# Patient Record
Sex: Female | Born: 1935 | Race: Black or African American | Hispanic: No | Marital: Married | Smoking: Former smoker
Health system: Southern US, Community
[De-identification: ages and names within clinical notes are randomized; demographics above are authoritative.]

## PROBLEM LIST (undated history)

## (undated) DIAGNOSIS — E213 Hyperparathyroidism, unspecified: Secondary | ICD-10-CM

## (undated) DIAGNOSIS — I10 Essential (primary) hypertension: Secondary | ICD-10-CM

## (undated) DIAGNOSIS — D649 Anemia, unspecified: Secondary | ICD-10-CM

## (undated) DIAGNOSIS — I509 Heart failure, unspecified: Secondary | ICD-10-CM

## (undated) DIAGNOSIS — K5790 Diverticulosis of intestine, part unspecified, without perforation or abscess without bleeding: Secondary | ICD-10-CM

## (undated) DIAGNOSIS — G8929 Other chronic pain: Secondary | ICD-10-CM

## (undated) DIAGNOSIS — E079 Disorder of thyroid, unspecified: Secondary | ICD-10-CM

## (undated) HISTORY — PX: THYROIDECTOMY: SHX17

## (undated) HISTORY — DX: Other chronic pain: G89.29

## (undated) HISTORY — DX: Hyperparathyroidism, unspecified: E21.3

## (undated) HISTORY — DX: Anemia, unspecified: D64.9

## (undated) HISTORY — PX: COLONOSCOPY: SHX174

## (undated) HISTORY — PX: ABDOMINAL HYSTERECTOMY: SHX81

---

## 2005-08-13 ENCOUNTER — Ambulatory Visit: Payer: Self-pay | Admitting: Family Medicine

## 2006-08-16 ENCOUNTER — Ambulatory Visit: Payer: Self-pay | Admitting: Family Medicine

## 2007-11-07 ENCOUNTER — Ambulatory Visit: Payer: Self-pay | Admitting: Family Medicine

## 2009-04-11 ENCOUNTER — Ambulatory Visit: Payer: Self-pay | Admitting: Family Medicine

## 2009-04-18 ENCOUNTER — Ambulatory Visit: Payer: Self-pay | Admitting: Family Medicine

## 2010-10-23 ENCOUNTER — Ambulatory Visit: Payer: Self-pay | Admitting: Family Medicine

## 2011-10-27 ENCOUNTER — Ambulatory Visit: Payer: Self-pay | Admitting: Family Medicine

## 2013-10-19 ENCOUNTER — Ambulatory Visit: Payer: Self-pay

## 2013-10-19 LAB — COMPREHENSIVE METABOLIC PANEL
ANION GAP: 8 (ref 7–16)
AST: 20 U/L (ref 15–37)
Albumin: 4.5 g/dL (ref 3.4–5.0)
Alkaline Phosphatase: 74 U/L
BUN: 17 mg/dL (ref 7–18)
Bilirubin,Total: 0.7 mg/dL (ref 0.2–1.0)
CHLORIDE: 100 mmol/L (ref 98–107)
Calcium, Total: 11.4 mg/dL — ABNORMAL HIGH (ref 8.5–10.1)
Co2: 31 mmol/L (ref 21–32)
Creatinine: 1.02 mg/dL (ref 0.60–1.30)
EGFR (African American): >60
EGFR (Non-African Amer.): 54 — ABNORMAL LOW
Glucose: 95 mg/dL (ref 65–99)
Osmolality: 279 (ref 275–301)
POTASSIUM: 4 mmol/L (ref 3.5–5.1)
SGPT (ALT): 18 U/L (ref 12–78)
Sodium: 139 mmol/L (ref 136–145)
Total Protein: 8.3 g/dL — ABNORMAL HIGH (ref 6.4–8.2)

## 2013-10-19 LAB — CBC WITH DIFFERENTIAL/PLATELET
Basophil #: 0 10*3/uL (ref 0.0–0.1)
Basophil %: 0.7 %
EOS ABS: 0 10*3/uL (ref 0.0–0.7)
EOS PCT: 0.3 %
HCT: 41.6 % (ref 35.0–47.0)
HGB: 14.1 g/dL (ref 12.0–16.0)
Lymphocyte #: 1.7 10*3/uL (ref 1.0–3.6)
Lymphocyte %: 26.7 %
MCH: 30.6 pg (ref 26.0–34.0)
MCHC: 34 g/dL (ref 32.0–36.0)
MCV: 90 fL (ref 80–100)
Monocyte #: 0.4 x10 3/mm (ref 0.2–0.9)
Monocyte %: 6.7 %
NEUTROS ABS: 4.1 10*3/uL (ref 1.4–6.5)
Neutrophil %: 65.6 %
PLATELETS: 244 10*3/uL (ref 150–440)
RBC: 4.62 10*6/uL (ref 3.80–5.20)
RDW: 12.5 % (ref 11.5–14.5)
WBC: 6.2 10*3/uL (ref 3.6–11.0)

## 2013-10-19 LAB — TROPONIN I: Troponin-I: <0.02 ng/mL

## 2013-10-20 ENCOUNTER — Observation Stay: Payer: Self-pay | Admitting: Internal Medicine

## 2013-10-20 LAB — BASIC METABOLIC PANEL
ANION GAP: 5 — AB (ref 7–16)
BUN: 18 mg/dL (ref 7–18)
CHLORIDE: 101 mmol/L (ref 98–107)
CO2: 30 mmol/L (ref 21–32)
Calcium, Total: 11.3 mg/dL — ABNORMAL HIGH (ref 8.5–10.1)
Creatinine: 1 mg/dL (ref 0.60–1.30)
GFR CALC NON AF AMER: 55 — AB
GLUCOSE: 88 mg/dL (ref 65–99)
Osmolality: 273 (ref 275–301)
Potassium: 3.5 mmol/L (ref 3.5–5.1)
Sodium: 136 mmol/L (ref 136–145)

## 2013-10-20 LAB — CBC WITH DIFFERENTIAL/PLATELET
BASOS PCT: 0.9 %
Basophil #: 0.1 10*3/uL (ref 0.0–0.1)
Eosinophil #: 0 10*3/uL (ref 0.0–0.7)
Eosinophil %: 0.3 %
HCT: 40.8 % (ref 35.0–47.0)
HGB: 14 g/dL (ref 12.0–16.0)
LYMPHS PCT: 28 %
Lymphocyte #: 2.1 10*3/uL (ref 1.0–3.6)
MCH: 31.6 pg (ref 26.0–34.0)
MCHC: 34.4 g/dL (ref 32.0–36.0)
MCV: 92 fL (ref 80–100)
MONO ABS: 0.6 x10 3/mm (ref 0.2–0.9)
Monocyte %: 8.1 %
NEUTROS ABS: 4.6 10*3/uL (ref 1.4–6.5)
NEUTROS PCT: 62.7 %
Platelet: 251 10*3/uL (ref 150–440)
RBC: 4.44 10*6/uL (ref 3.80–5.20)
RDW: 12.7 % (ref 11.5–14.5)
WBC: 7.3 10*3/uL (ref 3.6–11.0)

## 2013-10-20 LAB — CALCIUM: Calcium, Total: 10.7 mg/dL — ABNORMAL HIGH (ref 8.5–10.1)

## 2013-10-20 LAB — MAGNESIUM: Magnesium: 1.8 mg/dL

## 2013-10-20 LAB — TSH: Thyroid Stimulating Horm: 8.69 u[IU]/mL — ABNORMAL HIGH

## 2013-10-20 LAB — T4, FREE: Free Thyroxine: 0.94 ng/dL (ref 0.76–1.46)

## 2013-12-13 ENCOUNTER — Ambulatory Visit: Payer: Self-pay | Admitting: Gastroenterology

## 2015-01-19 NOTE — H&P (Signed)
PATIENT NAME:  Laura Barrera, Laura Barrera MR#:  [id number] DATE OF BIRTH:  04-22-1936  DATE OF ADMISSION:  10/20/2013  PRIMARY CARE PHYSICIAN:  Nonlocal.   REFERRING PHYSICIAN:  Dr. Bayard Malesandolph Brown.   CHIEF COMPLAINT:  The patient is sent from the urgent care center for hypercalcemia.   HISTORY OF PRESENT ILLNESS:  Laura Barrera is a 79 year old African American female with past medical history of hypertension, hyperlipidemia, has been feeling dizzy for the last two days.  Concerning this, went to the urgent care center where a work-up showed the patient has elevated calcium of 11.4.  Concerning this, the patient is sent to the Emergency Department for further work-up.  The patient denied having any polyuria or polydipsia.  Per husband, the patient's confusion is no worse than baseline.  The patient has been experiencing palpitations in the last few days.  Denies having any nausea, vomiting, diarrhea.  CBC, CMP are completely within normal limits except for calcium of 11.4.  The patient denies taking Tums on a regular basis.  Drinks milk occasionally.  Does not take calcium on a regular basis.   PAST MEDICAL HISTORY: 1.  Hypertension.  2.  Hyperlipidemia.   PAST SURGICAL HISTORY:  Hysterectomy.   ALLERGIES:  .   HOME MEDICATIONS: 1.  Metoprolol 100 mg daily.  2.  Lovastatin 40 mg daily.  3.  Lisinopril 1 tablet once a day.  4.  Hydrochlorothiazide 25 mg once a day.  5.  Amlodipine 10 mg once a day.   SOCIAL HISTORY:  No history of smoking, drinking alcohol or using illicit drugs.  Married, lives with her husband.   FAMILY HISTORY:  Diabetes mellitus, hypertension.   REVIEW OF SYSTEMS: CONSTITUTIONAL:  Has been experiencing mild generalized weakness.  EYES:  No change in vision.  EARS, NOSE, THROAT:  No change in hearing.  RESPIRATORY:  No cough, shortness of breath.  CARDIOVASCULAR:  No chest pain, palpitations.  GASTROINTESTINAL:  No nausea, vomiting, abdominal pain.   GENITOURINARY:  No dysuria  or hematuria.  ENDOCRINE:  No polyuria or polydipsia.  SKIN:  No rash or lesions.  MUSCULOSKELETAL:  No joint pains and aches.   NEUROLOGIC:  No weakness or numbness in any part of the body.   PHYSICAL EXAMINATION: GENERAL:  This is a well-built, well-nourished age-appropriate female lying down in the bed, not in distress.  VITAL SIGNS:  Temperature 98.3, pulse 89, blood pressure 160/90, respiratory rate of 18, oxygen saturation is 96% on room air.  HEENT:  Head normocephalic, atraumatic.  Eyes, no scleral icterus.  Conjunctivae normal.  Pupils equal and react to light.  Extraocular movements are intact.  Mucous membranes moist.  No pharyngeal erythema.  NECK:  Supple.  No lymphadenopathy.  No JVD.  No carotid bruit.  No thyromegaly.  CHEST:  Has no focal tenderness.  LUNGS:  Bilaterally clear to auscultation.  HEART:  S1, S2 regular.  No murmurs are heard.  ABDOMEN:  Bowel sounds plus, soft, nontender, nondistended.  EXTREMITIES:  No pedal edema.  Pulses 2+.  NEUROLOGIC:  The patient is alert, oriented to place, person and time.  Cranial nerves II through XII intact.  Motor 5 by 5 in upper and lower extremities.  Per urgent care center the patient was kind of confused and was not able to tell the year.  SKIN:  No rash or lesions.   LABORATORY DATA:  CMP, calcium of 11.4.  The rest of all the values are within normal limits.  Troponin less  than 0.02.  CT head without contrast:  No acute intracranial abnormality.   CBC is completely within normal limits.   ASSESSMENT AND PLAN:  Laura Barrera is a 79 year old female who comes to the Emergency Department with complaints of dizziness.  1.  Dizziness.  We will check the orthostatic blood pressure.  The patient also is complaining of palpitations.  Admit the patient to the monitored bed.  2.  Hypercalcemia.  This could be secondary to hydrochlorothiazide; however, we will also check the PTH level.  No other focal signs of any malignancy at this time.   We will continue with IV fluids and follow up.  3.  Hypertension, moderately-controlled.  Continue the home medications.  4.  Keep the patient on deep vein thrombosis prophylaxis with Lovenox.   TIME SPENT:  45 minutes.    ____________________________ Susa Griffins, MD pv:ea D: 10/20/2013 01:48:47 ET T: 10/20/2013 02:15:36 ET JOB#: [id number]  cc: Susa Griffins, MD, <Dictator> Susa Griffins MD ELECTRONICALLY SIGNED 10/22/2013 21:24

## 2015-01-19 NOTE — Discharge Summary (Signed)
PATIENT NAME:  Laura Barrera, [name] MR#:  [id number] DATE OF BIRTH:  11/14/1935  DATE OF ADMISSION:  10/20/2013  DATE OF DISCHARGE:  10/20/2013  DISCHARGE DIAGNOSES: 1.  Primary hyperparathyroidism with mild hypercalcemia, which is asymptomatic.  2.  Dizziness secondary to dehydration.   3.  Hypertension.  4.  Dementia.   ADMITTING HISTORY AND PHYSICAL AND HOSPITAL COURSE: Please see detailed H and P dictated previously. In brief, a 79 year old female patient presented to the urgent care with some dizziness. She does have some dementia and had confusion, which is not unusual for her. The patient was found to be hypercalcemic, and was admitted to the hospitalist service after presenting at the Emergency Room. The patient was treated with IV fluids. Her hydrochlorothiazide was stopped. Multiple lab studies have been ordered, which include PTH, vitamin D levels, TSH, T3, T4. PTH is mildly elevated at 132. Calcium has come down with IV hydration to 10.7. Her thiazide has been stopped. Vitamin D levels are pending at this time. TSH is mildly elevated, but T3/T4 are normal. The patient has been asked to follow up with her primary care physician, as her hypercalcemia is asymptomatic and has improved with hydration. I have stopped her HCTZ and I advised her to keep herself well hydrated. She will need another calcium repeated in 2 to 4 weeks with her primary care physician. Also, she will need routine followup on her calcium to decide if she needs any surgery or medication treatment for her hypercalcemia.   On examination today, the patient is oriented to place and person, but not to time, which is normal for her. Motor strength is 5/5. She feels well, and is being discharged home.   DISCHARGE MEDICATIONS INCLUDE:   1.  Metoprolol 100 mg oral once a day. 2.  Lisinopril 40 mg daily.  3.  Amlodipine 10 mg daily.  4.  Lovastatin 40 mg daily.   DISCHARGE INSTRUCTIONS: Low sodium diet. Activity as tolerated. Follow  up with primary care physician in 1 to 2 weeks for a repeat calcium check.   Time spent on day of discharge in discharge activity was 40 minutes.     ____________________________ Molinda BailiffSrikar R. Jarrod Mcenery, MD srs:mr D: 10/21/2013 14:26:33 ET T: 10/21/2013 18:38:35 ET JOB#: [id number]  cc: Wardell HeathSrikar R. Eaven Schwager, MD, <Dictator> Orie FishermanSRIKAR R Alayiah Fontes MD ELECTRONICALLY SIGNED 10/24/2013 10:18

## 2016-12-12 ENCOUNTER — Emergency Department
Admit: 2016-12-12 | Discharge: 2016-12-12 | Disposition: A | Discharge status: Home / Self Care | Payer: Medicare Other | Attending: Internal Medicine | Admitting: Internal Medicine

## 2016-12-12 ENCOUNTER — Encounter: Payer: Self-pay | Admitting: Emergency Medicine

## 2016-12-12 ENCOUNTER — Inpatient Hospital Stay
Admission: EM | Admit: 2016-12-12 | Discharge: 2016-12-14 | DRG: 291 | Disposition: A | Discharge status: Home Health | Payer: Medicare Other | Attending: Internal Medicine | Admitting: Internal Medicine

## 2016-12-12 ENCOUNTER — Emergency Department: Payer: Medicare Other

## 2016-12-12 DIAGNOSIS — J9600 Acute respiratory failure, unspecified whether with hypoxia or hypercapnia: Secondary | ICD-10-CM

## 2016-12-12 DIAGNOSIS — I420 Dilated cardiomyopathy: Secondary | ICD-10-CM | POA: Diagnosis present

## 2016-12-12 DIAGNOSIS — I110 Hypertensive heart disease with heart failure: Secondary | ICD-10-CM | POA: Diagnosis present

## 2016-12-12 DIAGNOSIS — E890 Postprocedural hypothyroidism: Secondary | ICD-10-CM | POA: Diagnosis present

## 2016-12-12 DIAGNOSIS — Z87891 Personal history of nicotine dependence: Secondary | ICD-10-CM | POA: Diagnosis not present

## 2016-12-12 DIAGNOSIS — Z888 Allergy status to other drugs, medicaments and biological substances status: Secondary | ICD-10-CM

## 2016-12-12 DIAGNOSIS — I272 Pulmonary hypertension, unspecified: Secondary | ICD-10-CM | POA: Diagnosis present

## 2016-12-12 DIAGNOSIS — E785 Hyperlipidemia, unspecified: Secondary | ICD-10-CM | POA: Diagnosis present

## 2016-12-12 DIAGNOSIS — Z90710 Acquired absence of both cervix and uterus: Secondary | ICD-10-CM | POA: Diagnosis not present

## 2016-12-12 DIAGNOSIS — Z79899 Other long term (current) drug therapy: Secondary | ICD-10-CM | POA: Diagnosis not present

## 2016-12-12 DIAGNOSIS — J9601 Acute respiratory failure with hypoxia: Secondary | ICD-10-CM | POA: Diagnosis present

## 2016-12-12 DIAGNOSIS — I5023 Acute on chronic systolic (congestive) heart failure: Secondary | ICD-10-CM | POA: Diagnosis present

## 2016-12-12 DIAGNOSIS — J449 Chronic obstructive pulmonary disease, unspecified: Secondary | ICD-10-CM | POA: Diagnosis present

## 2016-12-12 DIAGNOSIS — I10 Essential (primary) hypertension: Secondary | ICD-10-CM

## 2016-12-12 DIAGNOSIS — G4710 Hypersomnia, unspecified: Secondary | ICD-10-CM | POA: Diagnosis present

## 2016-12-12 DIAGNOSIS — R0602 Shortness of breath: Secondary | ICD-10-CM

## 2016-12-12 DIAGNOSIS — K5790 Diverticulosis of intestine, part unspecified, without perforation or abscess without bleeding: Secondary | ICD-10-CM | POA: Diagnosis present

## 2016-12-12 DIAGNOSIS — I509 Heart failure, unspecified: Secondary | ICD-10-CM

## 2016-12-12 DIAGNOSIS — I4510 Unspecified right bundle-branch block: Secondary | ICD-10-CM | POA: Diagnosis present

## 2016-12-12 DIAGNOSIS — I5021 Acute systolic (congestive) heart failure: Secondary | ICD-10-CM

## 2016-12-12 HISTORY — DX: Heart failure, unspecified: I50.9

## 2016-12-12 HISTORY — DX: Essential (primary) hypertension: I10

## 2016-12-12 HISTORY — DX: Disorder of thyroid, unspecified: E07.9

## 2016-12-12 HISTORY — DX: Diverticulosis of intestine, part unspecified, without perforation or abscess without bleeding: K57.90

## 2016-12-12 LAB — COMPREHENSIVE METABOLIC PANEL
ALT: 29 U/L (ref 14–54)
AST: 54 U/L — ABNORMAL HIGH (ref 15–41)
Albumin: 3.5 g/dL (ref 3.5–5.0)
Alkaline Phosphatase: 56 U/L (ref 38–126)
Anion gap: 6 (ref 5–15)
BUN: 15 mg/dL (ref 6–20)
CHLORIDE: 108 mmol/L (ref 101–111)
CO2: 28 mmol/L (ref 22–32)
Calcium: 9 mg/dL (ref 8.9–10.3)
Creatinine, Ser: 0.71 mg/dL (ref 0.44–1.00)
Glucose, Bld: 200 mg/dL — ABNORMAL HIGH (ref 65–99)
POTASSIUM: 4.8 mmol/L (ref 3.5–5.1)
Sodium: 142 mmol/L (ref 135–145)
Total Bilirubin: 1 mg/dL (ref 0.3–1.2)
Total Protein: 6.7 g/dL (ref 6.5–8.1)

## 2016-12-12 LAB — BLOOD GAS, ARTERIAL
ACID-BASE EXCESS: 3.3 mmol/L — AB (ref 0.0–2.0)
Bicarbonate: 29.5 mmol/L — ABNORMAL HIGH (ref 20.0–28.0)
DELIVERY SYSTEMS: POSITIVE
Expiratory PAP: 5
FIO2: 40
INSPIRATORY PAP: 10
O2 Saturation: 91 %
PATIENT TEMPERATURE: 37
PCO2 ART: 51 mmHg — AB (ref 32.0–48.0)
PO2 ART: 63 mmHg — AB (ref 83.0–108.0)
pH, Arterial: 7.37 (ref 7.350–7.450)

## 2016-12-12 LAB — URINALYSIS, COMPLETE (UACMP) WITH MICROSCOPIC
Bilirubin Urine: NEGATIVE
Glucose, UA: NEGATIVE mg/dL
HGB URINE DIPSTICK: NEGATIVE
Ketones, ur: NEGATIVE mg/dL
LEUKOCYTES UA: NEGATIVE
NITRITE: NEGATIVE
Protein, ur: 100 mg/dL — AB
SPECIFIC GRAVITY, URINE: 1.018 (ref 1.005–1.030)
pH: 6 (ref 5.0–8.0)

## 2016-12-12 LAB — CBC WITH DIFFERENTIAL/PLATELET
BASOS PCT: 1 %
Basophils Absolute: 0.1 10*3/uL (ref 0–0.1)
EOS PCT: 1 %
Eosinophils Absolute: 0.1 10*3/uL (ref 0–0.7)
HCT: 40.3 % (ref 35.0–47.0)
Hemoglobin: 13.4 g/dL (ref 12.0–16.0)
Lymphocytes Relative: 23 %
Lymphs Abs: 2.6 10*3/uL (ref 1.0–3.6)
MCH: 31.5 pg (ref 26.0–34.0)
MCHC: 33.3 g/dL (ref 32.0–36.0)
MCV: 94.6 fL (ref 80.0–100.0)
MONO ABS: 0.5 10*3/uL (ref 0.2–0.9)
Monocytes Relative: 5 %
Neutro Abs: 7.7 10*3/uL — ABNORMAL HIGH (ref 1.4–6.5)
Neutrophils Relative %: 70 %
PLATELETS: 234 10*3/uL (ref 150–440)
RBC: 4.26 MIL/uL (ref 3.80–5.20)
RDW: 15.4 % — AB (ref 11.5–14.5)
WBC: 11 10*3/uL (ref 3.6–11.0)

## 2016-12-12 LAB — GLUCOSE, CAPILLARY
GLUCOSE-CAPILLARY: 84 mg/dL (ref 65–99)
Glucose-Capillary: 93 mg/dL (ref 65–99)

## 2016-12-12 LAB — TROPONIN I
TROPONIN I: 0.03 ng/mL — AB (ref <0.03)
TROPONIN I: 0.04 ng/mL — AB (ref <0.03)

## 2016-12-12 LAB — BRAIN NATRIURETIC PEPTIDE: B NATRIURETIC PEPTIDE 5: 4253 pg/mL — AB (ref 0.0–100.0)

## 2016-12-12 LAB — PROTIME-INR
INR: 1.13
PROTHROMBIN TIME: 14.6 s (ref 11.4–15.2)

## 2016-12-12 LAB — APTT: aPTT: 52 seconds — ABNORMAL HIGH (ref 24–36)

## 2016-12-12 MED ORDER — VITAMIN D (ERGOCALCIFEROL) 1.25 MG (50000 UNIT) PO CAPS: 50000.0000 [IU] | ORAL_CAPSULE | ORAL | Status: DC

## 2016-12-12 MED ORDER — ROSUVASTATIN CALCIUM 10 MG PO TABS
5.0000 mg | ORAL_TABLET | Freq: Every day | ORAL | Status: DC
  Administered 2016-12-12 – 2016-12-14 (×3): 5 mg via ORAL
  Filled 2016-12-12 (×3): qty 1

## 2016-12-12 MED ORDER — ASPIRIN EC 81 MG PO TBEC
81.0000 mg | DELAYED_RELEASE_TABLET | Freq: Every day | ORAL | Status: DC
  Administered 2016-12-13 – 2016-12-14 (×2): 81 mg via ORAL
  Filled 2016-12-12 (×3): qty 1

## 2016-12-12 MED ORDER — DEXTROSE 5 % IV SOLN
1.0000 g | INTRAVENOUS | Status: DC
  Filled 2016-12-12: qty 10

## 2016-12-12 MED ORDER — DOCUSATE SODIUM 100 MG PO CAPS
100.0000 mg | ORAL_CAPSULE | Freq: Two times a day (BID) | ORAL | Status: DC
  Administered 2016-12-12 – 2016-12-14 (×5): 100 mg via ORAL
  Filled 2016-12-12 (×5): qty 1

## 2016-12-12 MED ORDER — SODIUM CHLORIDE 0.9% FLUSH: 3.0000 mL | INTRAVENOUS | Status: DC | PRN

## 2016-12-12 MED ORDER — ACETAMINOPHEN 650 MG RE SUPP: 650.0000 mg | Freq: Four times a day (QID) | RECTAL | Status: DC | PRN

## 2016-12-12 MED ORDER — SODIUM CHLORIDE 0.9 % IV SOLN: 250.0000 mL | INTRAVENOUS | Status: DC | PRN

## 2016-12-12 MED ORDER — SODIUM CHLORIDE 0.9% FLUSH
3.0000 mL | Freq: Two times a day (BID) | INTRAVENOUS | Status: DC
  Administered 2016-12-12 – 2016-12-13 (×4): 3 mL via INTRAVENOUS

## 2016-12-12 MED ORDER — IPRATROPIUM-ALBUTEROL 0.5-2.5 (3) MG/3ML IN SOLN
3.0000 mL | Freq: Four times a day (QID) | RESPIRATORY_TRACT | Status: DC
  Administered 2016-12-12 – 2016-12-14 (×7): 3 mL via RESPIRATORY_TRACT
  Filled 2016-12-12 (×7): qty 3

## 2016-12-12 MED ORDER — CARVEDILOL 6.25 MG PO TABS
6.2500 mg | ORAL_TABLET | Freq: Two times a day (BID) | ORAL | Status: DC
  Administered 2016-12-12 – 2016-12-14 (×4): 6.25 mg via ORAL
  Filled 2016-12-12 (×4): qty 1

## 2016-12-12 MED ORDER — CEFTRIAXONE SODIUM-DEXTROSE 1-3.74 GM-% IV SOLR
INTRAVENOUS | Status: AC
  Administered 2016-12-12: 1000 mg
  Filled 2016-12-12: qty 50

## 2016-12-12 MED ORDER — ACETAMINOPHEN 325 MG PO TABS: 650.0000 mg | ORAL_TABLET | Freq: Four times a day (QID) | ORAL | Status: DC | PRN

## 2016-12-12 MED ORDER — INSULIN ASPART 100 UNIT/ML ~~LOC~~ SOLN: 0.0000 [IU] | Freq: Three times a day (TID) | SUBCUTANEOUS | Status: DC

## 2016-12-12 MED ORDER — FUROSEMIDE 10 MG/ML IJ SOLN
60.0000 mg | Freq: Once | INTRAMUSCULAR | Status: AC
  Administered 2016-12-12: 60 mg via INTRAVENOUS
  Filled 2016-12-12: qty 8

## 2016-12-12 MED ORDER — ONDANSETRON HCL 4 MG/2ML IJ SOLN: 4.0000 mg | Freq: Four times a day (QID) | INTRAMUSCULAR | Status: DC | PRN

## 2016-12-12 MED ORDER — ENOXAPARIN SODIUM 40 MG/0.4ML ~~LOC~~ SOLN
40.0000 mg | Freq: Every day | SUBCUTANEOUS | Status: DC
  Administered 2016-12-12 – 2016-12-13 (×2): 40 mg via SUBCUTANEOUS
  Filled 2016-12-12 (×2): qty 0.4

## 2016-12-12 MED ORDER — BISACODYL 10 MG RE SUPP: 10.0000 mg | Freq: Every day | RECTAL | Status: DC | PRN

## 2016-12-12 MED ORDER — AMLODIPINE BESYLATE 10 MG PO TABS
10.0000 mg | ORAL_TABLET | Freq: Every day | ORAL | Status: DC
  Administered 2016-12-12 – 2016-12-13 (×2): 10 mg via ORAL
  Filled 2016-12-12 (×2): qty 1

## 2016-12-12 MED ORDER — LEVOTHYROXINE SODIUM 88 MCG PO TABS
88.0000 ug | ORAL_TABLET | Freq: Every day | ORAL | Status: DC
  Administered 2016-12-12 – 2016-12-14 (×3): 88 ug via ORAL
  Filled 2016-12-12 (×3): qty 1

## 2016-12-12 MED ORDER — FUROSEMIDE 10 MG/ML IJ SOLN
40.0000 mg | Freq: Two times a day (BID) | INTRAMUSCULAR | Status: DC
  Administered 2016-12-12 – 2016-12-13 (×2): 40 mg via INTRAVENOUS
  Filled 2016-12-12 (×2): qty 4

## 2016-12-12 MED ORDER — LISINOPRIL 20 MG PO TABS
40.0000 mg | ORAL_TABLET | Freq: Every day | ORAL | Status: DC
  Administered 2016-12-12 – 2016-12-13 (×2): 40 mg via ORAL
  Filled 2016-12-12 (×2): qty 2

## 2016-12-12 MED ORDER — ALPRAZOLAM 0.25 MG PO TABS
0.2500 mg | ORAL_TABLET | Freq: Once | ORAL | Status: AC
  Administered 2016-12-12: 0.25 mg via ORAL
  Filled 2016-12-12: qty 1

## 2016-12-12 MED ORDER — IPRATROPIUM-ALBUTEROL 0.5-2.5 (3) MG/3ML IN SOLN
RESPIRATORY_TRACT | Status: AC
  Administered 2016-12-12: 3 mL via RESPIRATORY_TRACT
  Filled 2016-12-12: qty 3

## 2016-12-12 MED ORDER — SODIUM CHLORIDE 0.9% FLUSH
3.0000 mL | Freq: Two times a day (BID) | INTRAVENOUS | Status: DC
  Administered 2016-12-12 (×2): 3 mL via INTRAVENOUS

## 2016-12-12 MED ORDER — POTASSIUM CHLORIDE CRYS ER 20 MEQ PO TBCR
20.0000 meq | EXTENDED_RELEASE_TABLET | Freq: Every day | ORAL | Status: DC
  Administered 2016-12-12 – 2016-12-14 (×3): 20 meq via ORAL
  Filled 2016-12-12 (×3): qty 1

## 2016-12-12 MED ORDER — ONDANSETRON HCL 4 MG PO TABS: 4.0000 mg | ORAL_TABLET | Freq: Four times a day (QID) | ORAL | Status: DC | PRN

## 2016-12-12 NOTE — ED Notes (Signed)
Spoke with Dr. Judithann SheenSparks re: pt oxygen sats. Pt has been stable on 4L and recently reduced to 3L Laura Barrera.  No SOB or labored breathing noted. Pt reports feeling better. Pt has had significant diuresis.  Plan discussed with patient and family.

## 2016-12-12 NOTE — H&P (Signed)
History and Physical    SERENITI WAN ZOX:[id number] DOB: 1936-03-22 DOA: 12/12/2016  Referring physician: Dr. Alphonzo Lemmings PCP: [facility]  Specialists: Dr. Lady Gary  Chief Complaint: SOB  HPI: KENNESHIA REHM is a 81 y.o. female has a past medical history significant for cardiomyopathy with EF=35% and CHF now with worsening SOB. Some cough. No fever. Denies CP. Weight at home has been stable. In ER, pt severely tachypneic and hypoxic requiring BiPAP. CXR shows CHF and possible RLL pneumonia. BNP elevated. She is now admitted. No N/V/D. Some LE edema  Review of Systems: The patient denies anorexia, fever, weight loss,, vision loss, decreased hearing, hoarseness, chest pain, syncope, balance deficits, hemoptysis, abdominal pain, melena, hematochezia, severe indigestion/heartburn, hematuria, incontinence, genital sores, muscle weakness, suspicious skin lesions, transient blindness, difficulty walking, depression, unusual weight change, abnormal bleeding, enlarged lymph nodes, angioedema, and breast masses.   Past Medical History:  Diagnosis Date  . Congestive heart failure (CHF) (HCC)   . Diverticulosis   . Hypertension   . Thyroid disease    Past Surgical History:  Procedure Laterality Date  . ABDOMINAL HYSTERECTOMY    . COLONOSCOPY    . THYROIDECTOMY     Social History:  reports that she has quit smoking. She has never used smokeless tobacco. Her alcohol and drug histories are not on file.  Allergies  Allergen Reactions  . Irbesartan Hives    History reviewed. No pertinent family history.  Prior to Admission medications   Medication Sig Start Date End Date Taking? Authorizing Provider  amLODipine (NORVASC) 10 MG tablet Take 10 mg by mouth daily. 11/30/16  Yes Historical Provider, MD  carvedilol (COREG) 6.25 MG tablet Take 6.25 mg by mouth 2 (two) times daily. 11/16/16  Yes Historical Provider, MD  furosemide (LASIX) 20 MG tablet Take 20 mg by mouth daily. 11/18/16  Yes  Historical Provider, MD  levothyroxine (SYNTHROID, LEVOTHROID) 88 MCG tablet Take 88 mcg by mouth daily. 11/28/16  Yes Historical Provider, MD  lisinopril (PRINIVIL,ZESTRIL) 40 MG tablet Take 40 mg by mouth daily. 12/01/16  Yes Historical Provider, MD  Multiple Vitamin (MULTIVITAMIN) tablet Take 1 tablet by mouth daily.   Yes Historical Provider, MD  potassium chloride SA (K-DUR,KLOR-CON) 20 MEQ tablet Take 20 mEq by mouth daily. 12/09/16  Yes Historical Provider, MD  rosuvastatin (CRESTOR) 5 MG tablet Take 5 mg by mouth daily. 11/12/16  Yes Historical Provider, MD  Vitamin D, Ergocalciferol, (DRISDOL) 50000 units CAPS capsule Take 50,000 Units by mouth once a week. 12/03/16  Yes Historical Provider, MD  hydrochlorothiazide (HYDRODIURIL) 12.5 MG tablet Take 12.5 mg by mouth daily. 11/04/16   Historical Provider, MD  lovastatin (MEVACOR) 40 MG tablet Take 40 mg by mouth daily. 11/03/16   Historical Provider, MD  metoprolol (LOPRESSOR) 100 MG tablet Take 100 mg by mouth 3 (three) times daily. 11/03/16   Historical Provider, MD   Physical Exam: Vitals:   12/12/16 0840 12/12/16 0847 12/12/16 0851 12/12/16 0852  BP:  (!) 146/97    Pulse:  100    Resp:  (!) 22    Temp:   97.6 F (36.4 C)   TempSrc:   Oral   SpO2: (!) 84% 97%    Weight:    72.6 kg (160 lb)  Height:    5\' 2"  (1.575 m)     General:  WDWN, [place]/AT, acutely ill appearing in moderate distress  Eyes: PERRL, EOMI, no scleral icterus, conjunctiva clear  ENT: moist oropharynx without exudate,  TM's benign, dentition good  Neck: supple, no lymphadenopathy. No bruits or thyromegaly.  Cardiovascular: regular rate without MRG; 2+ peripheral pulses, 2+ JVD, 1+ peripheral edema  Respiratory: bilateral rales without wheezes or rhonchi. No dullness. Respiratory effort increased  Abdomen: soft, non tender to palpation, positive bowel sounds, no guarding, no rebound  Skin: no rashes or lesions  Musculoskeletal: normal bulk and tone, no joint  swelling  Psychiatric: normal mood and affect, A&)X3  Neurologic: CN 2-12 grossly intact, Motor strength 5/5 in all 4 groups with symmetric DTR's and non-focal sensory exam  Labs on Admission:  Basic Metabolic Panel:  Recent Labs Lab 12/12/16 0853  NA 142  K 4.8  CL 108  CO2 28  GLUCOSE 200*  BUN 15  CREATININE 0.71  CALCIUM 9.0   Liver Function Tests:  Recent Labs Lab 12/12/16 0853  AST 54*  ALT 29  ALKPHOS 56  BILITOT 1.0  PROT 6.7  ALBUMIN 3.5   No results for input(s): LIPASE, AMYLASE in the last 168 hours. No results for input(s): AMMONIA in the last 168 hours. CBC:  Recent Labs Lab 12/12/16 0853  WBC 11.0  NEUTROABS 7.7*  HGB 13.4  HCT 40.3  MCV 94.6  PLT 234   Cardiac Enzymes:  Recent Labs Lab 12/12/16 0853  TROPONINI <0.03    BNP (last 3 results)  Recent Labs  12/12/16 0853  BNP 4,253.0*    ProBNP (last 3 results) No results for input(s): PROBNP in the last 8760 hours.  CBG: No results for input(s): GLUCAP in the last 168 hours.  Radiological Exams on Admission: Dg Chest Port 1 View  Result Date: 12/12/2016 CLINICAL DATA:  CHF and shortness of breath worsening. EXAM: PORTABLE CHEST 1 VIEW COMPARISON:  None. FINDINGS: Lungs are adequately inflated with mild hazy opacification of the left base/retrocardiac region which may be due to small effusion with atelectasis although cannot exclude infection. Minimal opacification in the right base which may be due to atelectasis or early infection. Minimal prominence of the perihilar markings. Moderate cardiomegaly. Minimal calcification over the aortic arch. Mild degenerate change of the spine. IMPRESSION: Bibasilar opacification left-greater-than-right as the left base process may be due to atelectasis and effusion, although cannot exclude infection. Atelectasis versus early infection right lung base. Moderate cardiomegaly with suggestion of mild vascular congestion. Aortic atherosclerosis.  Electronically Signed   By: Elberta Fortisaniel  Boyle M.D.   On: 12/12/2016 08:57    EKG: Independently reviewed.  Assessment/Plan Principal Problem:   Acute respiratory failure (HCC) Active Problems:   Acute systolic CHF (congestive heart failure) (HCC)   Dilated cardiomyopathy (HCC)   HTN (hypertension)   [facility] to wean [facility] Bi[facility]. IV lasix q12h. Begin empiric IV ABX and SVN's. Recheck echo. Follow sugars. Consult Cardiology and Pulmonology. Follow enzymes. Repeat labs and CXR in AM.  Diet: clear liquids Fluids: saline lock DVT Prophylaxis: Lovenox  Code Status: FULL  Family Communication: yes  Disposition Plan: home  Time spent: 55 min

## 2016-12-12 NOTE — Consult Note (Addendum)
Reason for Consult: Shortness of breath congestive heart failure Referring Physician: Georgie Chard hospitalist, primary care [facility] Cardiologist Dr. Charleston Ropes Laura Barrera is an 81 y.o. female.  HPI: Patient 81 year old female significant cardiomyopathy EF around 35% nonischemic congestive heart failure presented with shortness of breath congestion elevated BNP. Patient states symptoms ongoing for about a week or so and got progressive. Patient complains of significant tachypnea was hypoxic requiring BiPAP in emergency room chest x-ray shows possible heart failure. Patient denies any significant chest pain so was admitted for further assessment and evaluation.  Past Medical History:  Diagnosis Date  . Congestive heart failure (CHF) ([place])   . Diverticulosis   . Hypertension   . Thyroid disease     Past Surgical History:  Procedure Laterality Date  . ABDOMINAL HYSTERECTOMY    . COLONOSCOPY    . THYROIDECTOMY      History reviewed. No pertinent family history.  Social History:  reports that she has quit smoking. She has never used smokeless tobacco. Her alcohol and drug histories are not on file.  Allergies:  Allergies  Allergen Reactions  . Irbesartan Hives    Medications: I have reviewed the patient's current medications.  Results for orders placed or performed during the hospital encounter of 12/12/16 (from the past 48 hour(s))  Comprehensive metabolic panel     Status: Abnormal   Collection Time: 12/12/16  8:53 AM  Result Value Ref Range   Sodium 142 135 - 145 mmol/L   Potassium 4.8 3.5 - 5.1 mmol/L   Chloride 108 101 - 111 mmol/L   CO2 28 22 - 32 mmol/L   Glucose, Bld 200 (H) 65 - 99 mg/dL   BUN 15 6 - 20 mg/dL   Creatinine, Ser 0.71 0.44 - 1.00 mg/dL   Calcium 9.0 8.9 - 10.3 mg/dL   Total Protein 6.7 6.5 - 8.1 g/dL   Albumin 3.5 3.5 - 5.0 g/dL   AST 54 (H) 15 - 41 U/L   ALT 29 14 - 54 U/L   Alkaline Phosphatase 56 38 - 126 U/L   Total Bilirubin 1.0 0.3 -  1.2 mg/dL   GFR calc non Af Amer >60 >60 mL/min   GFR calc Af Amer >60 >60 mL/min    Comment: (NOTE) The eGFR has been calculated using the CKD EPI equation. This calculation has not been validated in all clinical situations. eGFR's persistently <60 mL/min signify possible Chronic Kidney Disease.    Anion gap 6 5 - 15  Protime-INR     Status: None   Collection Time: 12/12/16  8:53 AM  Result Value Ref Range   Prothrombin Time 14.6 11.4 - 15.2 seconds   INR 1.13   APTT     Status: Abnormal   Collection Time: 12/12/16  8:53 AM  Result Value Ref Range   aPTT 52 (H) 24 - 36 seconds    Comment:        IF BASELINE aPTT IS ELEVATED, SUGGEST PATIENT RISK ASSESSMENT BE USED TO DETERMINE APPROPRIATE ANTICOAGULANT THERAPY.   Brain natriuretic peptide     Status: Abnormal   Collection Time: 12/12/16  8:53 AM  Result Value Ref Range   B Natriuretic Peptide 4,253.0 (H) 0.0 - 100.0 pg/mL  Troponin I     Status: None   Collection Time: 12/12/16  8:53 AM  Result Value Ref Range   Troponin I <0.03 <0.03 ng/mL  CBC with Differential     Status: Abnormal   Collection Time:  12/12/16  8:53 AM  Result Value Ref Range   WBC 11.0 3.6 - 11.0 K/uL   RBC 4.26 3.80 - 5.20 MIL/uL   Hemoglobin 13.4 12.0 - 16.0 g/dL   HCT 40.3 35.0 - 47.0 %   MCV 94.6 80.0 - 100.0 fL   MCH 31.5 26.0 - 34.0 pg   MCHC 33.3 32.0 - 36.0 g/dL   RDW 15.4 (H) 11.5 - 14.5 %   Platelets 234 150 - 440 K/uL   Neutrophils Relative % 70 %   Neutro Abs 7.7 (H) 1.4 - 6.5 K/uL   Lymphocytes Relative 23 %   Lymphs Abs 2.6 1.0 - 3.6 K/uL   Monocytes Relative 5 %   Monocytes Absolute 0.5 0.2 - 0.9 K/uL   Eosinophils Relative 1 %   Eosinophils Absolute 0.1 0 - 0.7 K/uL   Basophils Relative 1 %   Basophils Absolute 0.1 0 - 0.1 K/uL  Urinalysis, Complete w Microscopic     Status: Abnormal   Collection Time: 12/12/16 10:03 AM  Result Value Ref Range   Color, Urine AMBER (A) YELLOW    Comment: BIOCHEMICALS MAY BE AFFECTED BY  COLOR   APPearance CLEAR (A) CLEAR   Specific Gravity, Urine 1.018 1.005 - 1.030   pH 6.0 5.0 - 8.0   Glucose, UA NEGATIVE NEGATIVE mg/dL   Hgb urine dipstick NEGATIVE NEGATIVE   Bilirubin Urine NEGATIVE NEGATIVE   Ketones, ur NEGATIVE NEGATIVE mg/dL   Protein, ur 100 (A) NEGATIVE mg/dL   Nitrite NEGATIVE NEGATIVE   Leukocytes, UA NEGATIVE NEGATIVE   RBC / HPF 0-5 0 - 5 RBC/hpf   WBC, UA 0-5 0 - 5 WBC/hpf   Bacteria, UA RARE (A) NONE SEEN   Squamous Epithelial / LPF 0-5 (A) NONE SEEN   Mucous PRESENT    Hyaline Casts, UA PRESENT   Blood gas, arterial     Status: Abnormal   Collection Time: 12/12/16 10:50 AM  Result Value Ref Range   FIO2 40.00    Delivery systems BILEVEL POSITIVE AIRWAY PRESSURE    Inspiratory PAP 10    Expiratory PAP 5    pH, Arterial 7.37 7.350 - 7.450   pCO2 arterial 51 (H) 32.0 - 48.0 mmHg   pO2, Arterial 63 (L) 83.0 - 108.0 mmHg   Bicarbonate 29.5 (H) 20.0 - 28.0 mmol/L   Acid-Base Excess 3.3 (H) 0.0 - 2.0 mmol/L   O2 Saturation 91.0 %   Patient temperature 37.0    Collection site RIGHT BRACHIAL    Sample type ARTERIAL DRAW    Allens test (pass/fail) PASS PASS  Glucose, capillary     Status: None   Collection Time: 12/12/16  2:19 PM  Result Value Ref Range   Glucose-Capillary 93 65 - 99 mg/dL  Troponin I     Status: Abnormal   Collection Time: 12/12/16  4:33 PM  Result Value Ref Range   Troponin I 0.03 (HH) <0.03 ng/mL    Comment: CRITICAL RESULT CALLED TO, READ BACK BY AND VERIFIED WITH ABIGAIL JACKSON ON 12/12/16 AT 1709 MNS   Glucose, capillary     Status: None   Collection Time: 12/12/16  4:49 PM  Result Value Ref Range   Glucose-Capillary 84 65 - 99 mg/dL    Dg Chest Port 1 View  Result Date: 12/12/2016 CLINICAL DATA:  CHF and shortness of breath worsening. EXAM: PORTABLE CHEST 1 VIEW COMPARISON:  None. FINDINGS: Lungs are adequately inflated with mild hazy opacification of the left base/retrocardiac region  which may be due to small  effusion with atelectasis although cannot exclude infection. Minimal opacification in the right base which may be due to atelectasis or early infection. Minimal prominence of the perihilar markings. Moderate cardiomegaly. Minimal calcification over the aortic arch. Mild degenerate change of the spine. IMPRESSION: Bibasilar opacification left-greater-than-right as the left base process may be due to atelectasis and effusion, although cannot exclude infection. Atelectasis versus early infection right lung base. Moderate cardiomegaly with suggestion of mild vascular congestion. Aortic atherosclerosis. Electronically Signed   By: Marin Olp M.D.   On: 12/12/2016 08:57    Review of Systems  Constitutional: Positive for malaise/fatigue.  HENT: Positive for congestion.   Eyes: Negative.   Respiratory: Positive for cough, shortness of breath and wheezing.   Cardiovascular: Positive for orthopnea, leg swelling and PND.  Gastrointestinal: Negative.   Genitourinary: Negative.   Musculoskeletal: Negative.   Skin: Negative.   Neurological: Positive for weakness.  Endo/Heme/Allergies: Negative.   Psychiatric/Behavioral: Positive for memory loss.   Blood pressure 122/74, pulse 85, temperature 97.5 F (36.4 C), temperature source Oral, resp. rate 18, height 5' 2"  (1.575 m), weight 72.8 kg (160 lb 8 oz), SpO2 91 %. Physical Exam  Nursing note and vitals reviewed. Constitutional: She is oriented to person, place, and time. She appears well-developed and well-nourished.  HENT:  Head: Normocephalic and atraumatic.  Eyes: Conjunctivae and EOM are normal. Pupils are equal, round, and reactive to light.  Neck: Normal range of motion. Neck supple.  Cardiovascular: Normal rate and regular rhythm.   Murmur heard. Respiratory: Accessory muscle usage present. She has decreased breath sounds in the right upper field, the right middle field, the right lower field, the left upper field, the left middle field and the  left lower field. She has rhonchi.  GI: Soft. Bowel sounds are normal.  Musculoskeletal: Normal range of motion. She exhibits edema.  Neurological: She is alert and oriented to person, place, and time. She has normal reflexes.  Skin: Skin is warm and dry.  Psychiatric: She has a normal mood and affect.    Assessment/Plan: Shortness of breath Congestive heart failure COPD Leg edema Hypertension Thyroid disease Hyperlipidemia Elevated BNP Hypoxemia . PLAN Agree with supplemental oxygen therapy Continue inhalers Recommend pulmonary input Aggressive therapy for heart failure Continue diuretic therapy Support stockings for leg edema Maintain hypertension control Echocardiogram will be helpful for assessment of left ventricular function Do not recommend invasive strategy at this point Continue to follow-up with EKGs and troponins  Chasin Findling D Davarious Tumbleson 12/12/2016, 9:31 PM

## 2016-12-12 NOTE — ED Provider Notes (Addendum)
[facility] Emergency Department Provider Note  ____________________________________________   I have reviewed the triage vital signs and the nursing notes.   HISTORY  Chief Complaint Shortness of Breath and Chest Pain    HPI Laura Barrera is a 81 y.o. female with a history of CHFhypertension hyperlipidemia, last echo was February 13 of this year which showed an EF of 35%, presents today complaining of shortness of breath leg swelling and chest pain. She is not actually having chest pain at this time. She may have been having for EMS. She was found by EMS with sats in the 70s. She has come up on auction. EKG for them showed a right bundle branch block which is known for this patient. She states that she's been feeling short of breath for a few days. Very poor historian at this time. History is therefore quite limited.Level 5 chart caveat; no further history available due to patient status. She cannot for example tell me anything about the chest pain except for that she is not having it now. She is anxious and upset and having difficulty breathing. She did receive aspirin, DuoNeb and nitroglycerin from EMS     No past medical history on file.  There are no active problems to display for this patient.   No past surgical history on file.  Prior to Admission medications   Medication Sig Start Date End Date Taking? Authorizing Provider  potassium chloride SA (K-DUR,KLOR-CON) 20 MEQ tablet Take 20 mEq by mouth daily. 12/09/16  Yes Historical Provider, MD  amLODipine (NORVASC) 10 MG tablet Take 10 mg by mouth daily. 11/30/16   Historical Provider, MD  carvedilol (COREG) 6.25 MG tablet Take 6.25 mg by mouth 2 (two) times daily. 11/16/16   Historical Provider, MD  furosemide (LASIX) 20 MG tablet Take 20 mg by mouth daily. 11/18/16   Historical Provider, MD  hydrochlorothiazide (HYDRODIURIL) 12.5 MG tablet Take 12.5 mg by mouth daily. 11/04/16   Historical Provider, MD   levothyroxine (SYNTHROID, LEVOTHROID) 88 MCG tablet Take 88 mcg by mouth daily. 11/28/16   Historical Provider, MD  lisinopril (PRINIVIL,ZESTRIL) 40 MG tablet Take 40 mg by mouth daily. 12/01/16   Historical Provider, MD  lovastatin (MEVACOR) 40 MG tablet Take 40 mg by mouth daily. 11/03/16   Historical Provider, MD  metoprolol (LOPRESSOR) 100 MG tablet Take 100 mg by mouth 3 (three) times daily. 11/03/16   Historical Provider, MD  rosuvastatin (CRESTOR) 5 MG tablet Take 5 mg by mouth daily. 11/12/16   Historical Provider, MD  Vitamin D, Ergocalciferol, (DRISDOL) 50000 units CAPS capsule Take 50,000 Units by mouth once a week. 12/03/16   Historical Provider, MD    Allergies Patient has no allergy information on record.  No family history on file.  Social History Social History  Substance Use Topics  . Smoking status: Not on file  . Smokeless tobacco: Not on file  . Alcohol use Not on file    Review of Systems Constitutional: No fever/chills Eyes: No visual changes. ENT: No sore throat. No stiff neck no neck pain Cardiovascular: Plus chest pain. Respiratory: Plus shortness of breath. Gastrointestinal:   no vomiting.  No diarrhea.  No constipation. Genitourinary: Negative for dysuria. Musculoskeletal: Plus lower extremity swelling Skin: Negative for rash. Neurological: Negative for severe headaches, focal weakness or numbness. 10-point ROS otherwise negative.  ____________________________________________   PHYSICAL EXAM:  VITAL SIGNS: ED Triage Vitals [12/12/16 0840]  Enc Vitals Group     BP  Pulse      Resp      Temp      Temp src      SpO2 (!) 84 %     Weight      Height      Head Circumference      Peak Flow      Pain Score      Pain Loc      Pain Edu?      Excl. in GC?     Constitutional: Alert and oriented.She appears fatigued, speaks in 3 word sentences using mild expiratory and inspiratory accessory muscles Eyes: Conjunctivae are normal. PERRL. EOMI. Head:  Atraumatic. Nose: No congestion/rhinnorhea. Mouth/Throat: Mucous membranes are moist.  Oropharynx non-erythematous. Neck: No stridor.   Nontender with no meningismus Cardiovascular: Normal rate, regular rhythm. Grossly normal heart sounds.  Good peripheral circulation. Respiratory: Increased respiratory effort, lungs diminished in the bases Abdominal: Soft and nontender. No distention. No guarding no rebound Back:  There is no focal tenderness or step off.  there is no midline tenderness there are no lesions noted. there is no CVA tenderness Musculoskeletal: No lower extremity tenderness, no upper extremity tenderness. No joint effusions, no DVT signs strong distal pulses no edema Neurologic:  Normal speech and language. No gross focal neurologic deficits are appreciated.  Skin:  Skin is warm, dry and intact. No rash noted. Psychiatric: Mood and affect are normal. Speech and behavior are normal.  ____________________________________________   LABS (all labs ordered are listed, but only abnormal results are displayed)  Labs Reviewed  COMPREHENSIVE METABOLIC PANEL  PROTIME-INR  APTT  BRAIN NATRIURETIC PEPTIDE  TROPONIN I  CBC WITH DIFFERENTIAL/PLATELET   ____________________________________________  EKG  I personally interpreted any EKGs ordered by me or triage Right bundle-branch block sinus rhythm rate 97 bpm nonspecific ST changes PVCs noted ____________________________________________  RADIOLOGY  I reviewed any imaging ordered by me or triage that were performed during my shift and, if possible, patient and/or family made aware of any abnormal findings. ____________________________________________   PROCEDURES  Procedure(s) performed: None  Procedures  Critical Care performed: CRITICAL CARE Performed by: Jeanmarie Plant   Total critical care time: 45 minutes  Critical care time was exclusive of separately billable procedures and treating other  patients.  Critical care was necessary to treat or prevent imminent or life-threatening deterioration.  Critical care was time spent personally by me on the following activities: development of treatment plan with patient and/or surrogate as well as nursing, discussions with consultants, evaluation of patient's response to treatment, examination of patient, obtaining history from patient or surrogate, ordering and performing treatments and interventions, ordering and review of laboratory studies, ordering and review of radiographic studies, pulse oximetry and re-evaluation of patient's condition.   ____________________________________________   INITIAL IMPRESSION / ASSESSMENT AND PLAN / ED COURSE  Pertinent labs & imaging results that were available during my care of the patient were reviewed by me and considered in my medical decision making (see chart for details).  Patient presents in respiratory distress with a history of CHF. Initial checks x-ray performed in the room to my spot really looks like fluid, clinically, she looks so she has decompensated CHF. She has already received nitroglycerin from EMS. We will place the patient on BiPAP as I think this will help. We'll give her Lasix. She will require cardiac evaluation and admission. At this time she does not require intubation is my hope that we can forestall that with BiPAP if she tolerates  it. Certainly could also be ACS present low suspicion for PE or dissection. He is also a consideration, but I don't think it's the primary pathology. Nothing to suggest at this time dissection. No evidence of PT X.  ----------------------------------------- 9:01 AM on 12/12/2016 -----------------------------------------  Daughter at bedside does state that the patient has been aching her Lasix she is newly diagnosed with CHF which she states as of February she states that the patient has had no trouble breathing until overnight last night. Last night  she was fine. No antecedent cough or cold symptoms. No exertional dyspnea noted by family. No antecedent complaints of chest pain. Patient is now on BiPAP and very comfortable compared to bleed. She is given me the thumbs up and she seems much more disease. We'll place a Foley catheter as I will have to diurese her. Chest x-ray does appear to have vascular congestion which is clinically consistent with her exam.    ____________________________________________   FINAL CLINICAL IMPRESSION(S) / ED DIAGNOSES  Final diagnoses:  SOB (shortness of breath)      This chart was dictated using voice recognition software.  Despite best efforts to proofread,  errors can occur which can change meaning.      Jeanmarie Plant, MD 12/12/16 9629    Jeanmarie Plant, MD 12/12/16 5284    Jeanmarie Plant, MD 12/12/16 1324    Jeanmarie Plant, MD 12/12/16 [phone]

## 2016-12-12 NOTE — ED Notes (Signed)
Bipap removed at this time. Placed on 4L [place]

## 2016-12-12 NOTE — Progress Notes (Addendum)
DR Judithann SheenSPARKS WAS INFORMED ABOUT PT 'S TROP. LEVEL 0.03, NO NEW ORDER WILL CONTINUE TO MONITOR

## 2016-12-12 NOTE — ED Triage Notes (Addendum)
Pt arrived via EMS from home with reports of shortness of breath and chest pain that started about 2 hours ago.  When EMS arrived, pt sats were in the 70s on RA. Pt placed on NRB sating 98%,  Pt given 3 NITRO, 2 DUONEB and 324 ASA with EMS.  Pt unable to speak in short sentences without running out of breath.

## 2016-12-13 ENCOUNTER — Inpatient Hospital Stay: Payer: Medicare Other

## 2016-12-13 LAB — CBC
HCT: 35 % (ref 35.0–47.0)
Hemoglobin: 12.1 g/dL (ref 12.0–16.0)
MCH: 31.9 pg (ref 26.0–34.0)
MCHC: 34.5 g/dL (ref 32.0–36.0)
MCV: 92.3 fL (ref 80.0–100.0)
PLATELETS: 169 10*3/uL (ref 150–440)
RBC: 3.79 MIL/uL — AB (ref 3.80–5.20)
RDW: 14.6 % — AB (ref 11.5–14.5)
WBC: 5.9 10*3/uL (ref 3.6–11.0)

## 2016-12-13 LAB — COMPREHENSIVE METABOLIC PANEL WITH GFR
ALT: 21 U/L (ref 14–54)
AST: 36 U/L (ref 15–41)
Albumin: 2.9 g/dL — ABNORMAL LOW (ref 3.5–5.0)
Alkaline Phosphatase: 48 U/L (ref 38–126)
Anion gap: 5 (ref 5–15)
BUN: 13 mg/dL (ref 6–20)
CO2: 33 mmol/L — ABNORMAL HIGH (ref 22–32)
Calcium: 8.6 mg/dL — ABNORMAL LOW (ref 8.9–10.3)
Chloride: 103 mmol/L (ref 101–111)
Creatinine, Ser: 0.82 mg/dL (ref 0.44–1.00)
GFR calc Af Amer: >60 mL/min
GFR calc non Af Amer: >60 mL/min
Glucose, Bld: 88 mg/dL (ref 65–99)
Potassium: 3.8 mmol/L (ref 3.5–5.1)
Sodium: 141 mmol/L (ref 135–145)
Total Bilirubin: 1.1 mg/dL (ref 0.3–1.2)
Total Protein: 5.6 g/dL — ABNORMAL LOW (ref 6.5–8.1)

## 2016-12-13 LAB — TROPONIN I: Troponin I: 0.03 ng/mL

## 2016-12-13 LAB — HEMOGLOBIN A1C
Hgb A1c MFr Bld: 5.3 % (ref 4.8–5.6)
Mean Plasma Glucose: 105 mg/dL

## 2016-12-13 LAB — ECHOCARDIOGRAM COMPLETE
Height: 62 in
Weight: 2560 [oz_av]

## 2016-12-13 MED ORDER — FUROSEMIDE 10 MG/ML IJ SOLN: 40.0000 mg | Freq: Two times a day (BID) | INTRAMUSCULAR | Status: DC

## 2016-12-13 MED ORDER — FUROSEMIDE 10 MG/ML IJ SOLN
20.0000 mg | Freq: Two times a day (BID) | INTRAMUSCULAR | Status: DC
  Administered 2016-12-13 – 2016-12-14 (×2): 20 mg via INTRAVENOUS
  Filled 2016-12-13 (×2): qty 2

## 2016-12-13 NOTE — Progress Notes (Signed)
[facility] [place] [facility]   PATIENT NAME: Laura Barrera    MR#:  [id number]  DATE OF BIRTH:  1936/06/19  SUBJECTIVE:    REVIEW OF SYSTEMS:   ROS Tolerating Diet: Tolerating PT:   DRUG ALLERGIES:   Allergies  Allergen Reactions  . Irbesartan Hives    VITALS:  Blood pressure (!) 90/56, pulse 80, temperature 97.6 F (36.4 C), temperature source Oral, resp. rate 10, height 5\' 2"  (1.575 m), weight 72.4 kg (159 lb 9.6 oz), SpO2 94 %.  PHYSICAL EXAMINATION:   Physical Exam  GENERAL:  81 y.o.-year-old patient lying in the bed with no acute distress.  EYES: Pupils equal, round, reactive to light and accommodation. No scleral icterus. Extraocular muscles intact.  HEENT: Head atraumatic, normocephalic. Oropharynx and nasopharynx clear.  NECK:  Supple, no jugular venous distention. No thyroid enlargement, no tenderness.  LUNGS: Normal breath sounds bilaterally, no wheezing, rales, rhonchi. No use of accessory muscles of respiration.  CARDIOVASCULAR: S1, S2 normal. No murmurs, rubs, or gallops.  ABDOMEN: Soft, nontender, nondistended. Bowel sounds present. No organomegaly or mass.  EXTREMITIES: No cyanosis, clubbing or edema b/l.    NEUROLOGIC: Cranial nerves II through XII are intact. No focal Motor or sensory deficits b/l.   PSYCHIATRIC:  patient is alert and oriented x 3.  SKIN: No obvious rash, lesion, or ulcer.   LABORATORY PANEL:  CBC  Recent Labs Lab 12/13/16 0343  WBC 5.9  HGB 12.1  HCT 35.0  PLT 169    Chemistries   Recent Labs Lab 12/13/16 0343  NA 141  K 3.8  CL 103  CO2 33*  GLUCOSE 88  BUN 13  CREATININE 0.82  CALCIUM 8.6*  AST 36  ALT 21  ALKPHOS 48  BILITOT 1.1   Cardiac Enzymes  Recent Labs Lab 12/13/16 0343  TROPONINI 0.03*   RADIOLOGY:  Dg Chest Port 1 View  Result Date: 12/13/2016 CLINICAL DATA:  CHF. EXAM: PORTABLE CHEST 1 VIEW COMPARISON:  12/12/2016 FINDINGS: Lungs are adequately inflated with  persistent left base/ retrocardiac opacification likely effusion with atelectasis. Mild stable right base opacification likely small effusion with atelectasis. Cannot exclude infection in the lung bases. Improved perihilar markings compatible with resolving vascular congestion. Stable cardiomegaly. Remainder of the exam is unchanged. IMPRESSION: Near resolution of previously noted vascular congestion. Stable moderate cardiomegaly. Persistent left base opacification and mild right base opacification compatible with small effusions with atelectasis left worse than right. Cannot exclude infection in the lung bases. Electronically Signed   By: Elberta Fortis M.D.   On: 12/13/2016 07:25   Dg Chest Port 1 View  Result Date: 12/12/2016 CLINICAL DATA:  CHF and shortness of breath worsening. EXAM: PORTABLE CHEST 1 VIEW COMPARISON:  None. FINDINGS: Lungs are adequately inflated with mild hazy opacification of the left base/retrocardiac region which may be due to small effusion with atelectasis although cannot exclude infection. Minimal opacification in the right base which may be due to atelectasis or early infection. Minimal prominence of the perihilar markings. Moderate cardiomegaly. Minimal calcification over the aortic arch. Mild degenerate change of the spine. IMPRESSION: Bibasilar opacification left-greater-than-right as the left base process may be due to atelectasis and effusion, although cannot exclude infection. Atelectasis versus early infection right lung base. Moderate cardiomegaly with suggestion of mild vascular congestion. Aortic atherosclerosis. Electronically Signed   By: Elberta Fortis M.D.   On: 12/12/2016 08:57   ASSESSMENT AND PLAN:  Laura Barrera is a 81 y.o. female  has a past medical history significant for cardiomyopathy with EF=35% and CHF now with worsening SOB. Some cough. No fever. Denies CP. Weight at home has been stable. In ER, pt severely tachypneic and hypoxic requiring BiPAP. CXR shows  CHF   1. Acute on chronic congestive heart failure systolic with known EF of 35% by echo in February 2018 which was done as outpatient with cardiologist Dr. Vikki PortsF ATH -Patient responding very well to IV Lasix -Diuresed about 3500 cc. Change to oral Lasix tomorrow -We'll wean oxygen to room air -Continue Coreg,, lisinopril and amlodipine -Overnight pulse oximetry  2. Hyperlipidemia Crestor  3. Hypothyroidism continue Synthroid  4. DVT prophylaxis subcutaneous Lovenox Case discussed with Care Management/Social Worker. Management plans discussed with the patient, family and they are in agreement.  CODE STATUS: **Full  DVT Prophylaxis: Lovenox  TOTAL TIME TAKING CARE OF THIS PATIENT: 25 minutes.  >50% time spent on counselling and coordination of care  POSSIBLE D/C IN one to 2 DAYS, DEPENDING ON CLINICAL CONDITION.  Note: This dictation was prepared with Dragon dictation along with smaller phrase technology. Any transcriptional errors that result from this process are unintentional.  Nathin Saran M.D on 12/13/2016 at 1:26 PM  Between 7am to 6pm - Pager - [phone]  After 6pm go to [email or web address] - Social research officer, governmentpassword [email or web address] [email or web address]  [facility]  Office  [phone]  CC: Primary care physician; [facility]

## 2016-12-13 NOTE — Progress Notes (Signed)
Pt placed on overnight oximetry. Pt O2 saturation on Room Air is 86%. Pt unable to perform overnight on Room Air. Pt returned to 2L [place] for overnight oximetry.

## 2016-12-13 NOTE — Progress Notes (Signed)
MD Pyreddy aware of 0.04 troponin. No orders at this time

## 2016-12-13 NOTE — Consult Note (Signed)
Pulmonary Critical Care  Initial Consult Note   Laura Barrera ZOX:[id number] DOB: 07/25/1936 DOA: 12/12/2016  Referring physician: Aletha HalimJeff Sparks, MD PCP: [facility]   Chief Complaint: Shortness of breath  HPI: Laura CollumBetty F Barrera is a 81 y.o. female  With prior history congestive heart failure ejection fraction of approximately 35% diagnosed by echocardiogram and who follows Cardiology in the office.  Patient presented with increasing shortness of breath.  She is having a little bit of a cough also.  No distinct fevers were noted.  Presented to the emergency room with increasing respiratory rate and significant hypoxia.  Patient was started on BiPAP.  Patient was also noted to have an elevated BNP.  On review over previous echo she also had some pulmonary hypertension noted.  She does snore on occasion she states that she feels occasionally tired but can care E on during the daytime has been fairly active.  She has no prior history of coronary artery disease she has no prior history of STEMI or non STEMI.  She has never been investigated for sleep apnea.   Review of Systems:  Constitutional:  No weight loss, night sweats, Fevers, chills, +fatigue.  HEENT:  No headaches, nasal congestion, post nasal drip,  Cardio-vascular:  No chest pain, +PND, +swelling in lower extremities GI:  No heartburn, indigestion, abdominal pain, nausea, vomiting, diarrhea  Resp:  +shortness of breath +cough, No coughing up of blood.No wheezing Skin:  no rash or lesions.  Musculoskeletal:  No joint pain or swelling.   Remainder ROS performed and is unremarkable other than noted in HPI  Past Medical History:  Diagnosis Date  . Congestive heart failure (CHF) (HCC)   . Diverticulosis   . Hypertension   . Thyroid disease    Past Surgical History:  Procedure Laterality Date  . ABDOMINAL HYSTERECTOMY    . COLONOSCOPY    . THYROIDECTOMY     Social History:  reports that she has quit smoking. She  has never used smokeless tobacco. Her alcohol and drug histories are not on file.  Allergies  Allergen Reactions  . Irbesartan Hives    History reviewed. No pertinent family history.  Prior to Admission medications   Medication Sig Start Date End Date Taking? Authorizing Provider  amLODipine (NORVASC) 10 MG tablet Take 10 mg by mouth daily. 11/30/16  Yes Historical Provider, MD  carvedilol (COREG) 6.25 MG tablet Take 6.25 mg by mouth 2 (two) times daily. 11/16/16  Yes Historical Provider, MD  furosemide (LASIX) 20 MG tablet Take 20 mg by mouth daily. 11/18/16  Yes Historical Provider, MD  levothyroxine (SYNTHROID, LEVOTHROID) 88 MCG tablet Take 88 mcg by mouth daily. 11/28/16  Yes Historical Provider, MD  lisinopril (PRINIVIL,ZESTRIL) 40 MG tablet Take 40 mg by mouth daily. 12/01/16  Yes Historical Provider, MD  Multiple Vitamin (MULTIVITAMIN) tablet Take 1 tablet by mouth daily.   Yes Historical Provider, MD  potassium chloride SA (K-DUR,KLOR-CON) 20 MEQ tablet Take 20 mEq by mouth daily. 12/09/16  Yes Historical Provider, MD  rosuvastatin (CRESTOR) 5 MG tablet Take 5 mg by mouth daily. 11/12/16  Yes Historical Provider, MD  Vitamin D, Ergocalciferol, (DRISDOL) 50000 units CAPS capsule Take 50,000 Units by mouth once a week. 12/03/16  Yes Historical Provider, MD  hydrochlorothiazide (HYDRODIURIL) 12.5 MG tablet Take 12.5 mg by mouth daily. 11/04/16   Historical Provider, MD  lovastatin (MEVACOR) 40 MG tablet Take 40 mg by mouth daily. 11/03/16   Historical Provider, MD  metoprolol (LOPRESSOR)  100 MG tablet Take 100 mg by mouth 3 (three) times daily. 11/03/16   Historical Provider, MD   Physical Exam: Vitals:   12/13/16 0524 12/13/16 0741 12/13/16 0830 12/13/16 1201  BP:  128/78  (!) 90/56  Pulse:  81  80  Resp:  20  10  Temp:  98.1 F (36.7 C)  97.6 F (36.4 C)  TempSrc:  Oral  Oral  SpO2:  95% 94% 94%  Weight: 72.4 kg (159 lb 9.6 oz)     Height:        Wt Readings from Last 3 Encounters:   12/13/16 72.4 kg (159 lb 9.6 oz)    General:  Appears calm and comfortable Eyes: PERRL, normal lids, irises & conjunctiva ENT: grossly normal hearing, lips & tongue Neck: no LAD, masses or thyromegaly Cardiovascular: RRR, no m/r/g. No LE edema. Respiratory: CTA bilaterally, no w/r/r. Normal respiratory effort. Abdomen: soft, nontender Skin: no rash or induration seen on limited exam Musculoskeletal: grossly normal tone BUE/BLE Psychiatric: grossly normal mood and affect Neurologic: grossly non-focal.          Labs on Admission:  Basic Metabolic Panel:  Recent Labs Lab 12/12/16 0853 12/13/16 0343  NA 142 141  K 4.8 3.8  CL 108 103  CO2 28 33*  GLUCOSE 200* 88  BUN 15 13  CREATININE 0.71 0.82  CALCIUM 9.0 8.6*   Liver Function Tests:  Recent Labs Lab 12/12/16 0853 12/13/16 0343  AST 54* 36  ALT 29 21  ALKPHOS 56 48  BILITOT 1.0 1.1  PROT 6.7 5.6*  ALBUMIN 3.5 2.9*   No results for input(s): LIPASE, AMYLASE in the last 168 hours. No results for input(s): AMMONIA in the last 168 hours. CBC:  Recent Labs Lab 12/12/16 0853 12/13/16 0343  WBC 11.0 5.9  NEUTROABS 7.7*  --   HGB 13.4 12.1  HCT 40.3 35.0  MCV 94.6 92.3  PLT 234 169   Cardiac Enzymes:  Recent Labs Lab 12/12/16 0853 12/12/16 1633 12/12/16 2146 12/13/16 0343  TROPONINI <0.03 0.03* 0.04* 0.03*    BNP (last 3 results)  Recent Labs  12/12/16 0853  BNP 4,253.0*    ProBNP (last 3 results) No results for input(s): PROBNP in the last 8760 hours.  CBG:  Recent Labs Lab 12/12/16 1419 12/12/16 1649  GLUCAP 93 84    Radiological Exams on Admission: Dg Chest Port 1 View  Result Date: 12/13/2016 CLINICAL DATA:  CHF. EXAM: PORTABLE CHEST 1 VIEW COMPARISON:  12/12/2016 FINDINGS: Lungs are adequately inflated with persistent left base/ retrocardiac opacification likely effusion with atelectasis. Mild stable right base opacification likely small effusion with atelectasis. Cannot  exclude infection in the lung bases. Improved perihilar markings compatible with resolving vascular congestion. Stable cardiomegaly. Remainder of the exam is unchanged. IMPRESSION: Near resolution of previously noted vascular congestion. Stable moderate cardiomegaly. Persistent left base opacification and mild right base opacification compatible with small effusions with atelectasis left worse than right. Cannot exclude infection in the lung bases. Electronically Signed   By: Elberta Fortis M.D.   On: 12/13/2016 07:25   Dg Chest Port 1 View  Result Date: 12/12/2016 CLINICAL DATA:  CHF and shortness of breath worsening. EXAM: PORTABLE CHEST 1 VIEW COMPARISON:  None. FINDINGS: Lungs are adequately inflated with mild hazy opacification of the left base/retrocardiac region which may be due to small effusion with atelectasis although cannot exclude infection. Minimal opacification in the right base which may be due to atelectasis or early infection. Minimal  prominence of the perihilar markings. Moderate cardiomegaly. Minimal calcification over the aortic arch. Mild degenerate change of the spine. IMPRESSION: Bibasilar opacification left-greater-than-right as the left base process may be due to atelectasis and effusion, although cannot exclude infection. Atelectasis versus early infection right lung base. Moderate cardiomegaly with suggestion of mild vascular congestion. Aortic atherosclerosis. Electronically Signed   By: Elberta Fortis M.D.   On: 12/12/2016 08:57    EKG: Independently reviewed.  Assessment/Plan Principal Problem:   Acute respiratory failure (HCC) Active Problems:   Acute systolic CHF (congestive heart failure) (HCC)   Dilated cardiomyopathy (HCC)   HTN (hypertension)   CHF (congestive heart failure) (HCC)   1. CHF   Patient has known congestive heart failure with ejection fraction of 35%  She responded very nicely to diuresis Chest x-ray was repeated and this actually looks improved   Would follow-up with Cardiology and follow their recommendations  2. PAH She will need this to be further evaluated I will see her back in the office a week after discharge  She will likely need to have a sleep study done  3. Hypersomnia  as above she will wean a sleep study done will get this arranged as an outpatient  Code Status:  Full code  Family Communication:  Son and husband in the room Disposition Plan:  home  Time spent:  70 minutes    I have personally obtained a history, examined the patient, evaluated laboratory and imaging results, formulated the assessment and plan and placed orders.  The Patient requires high complexity decision making for assessment and support.    Yevonne Pax, MD [facility] Pulmonary Critical Care Medicine Sleep Medicine

## 2016-12-14 LAB — MAGNESIUM: Magnesium: 1.7 mg/dL (ref 1.7–2.4)

## 2016-12-14 MED ORDER — LISINOPRIL 20 MG PO TABS
20.0000 mg | ORAL_TABLET | Freq: Every day | ORAL | Status: DC
  Administered 2016-12-14: 20 mg via ORAL
  Filled 2016-12-14: qty 1

## 2016-12-14 MED ORDER — ASPIRIN 81 MG PO TBEC: 81.0000 mg | DELAYED_RELEASE_TABLET | Freq: Every day | ORAL | 0 refills | Status: DC

## 2016-12-14 MED ORDER — FUROSEMIDE 20 MG PO TABS
20.0000 mg | ORAL_TABLET | Freq: Every day | ORAL | Status: DC
  Administered 2016-12-14: 20 mg via ORAL
  Filled 2016-12-14: qty 1

## 2016-12-14 MED ORDER — AMLODIPINE BESYLATE 5 MG PO TABS
5.0000 mg | ORAL_TABLET | Freq: Every day | ORAL | 0 refills | Status: DC
Start: 2016-12-15 — End: 2017-02-15

## 2016-12-14 MED ORDER — AMLODIPINE BESYLATE 5 MG PO TABS
5.0000 mg | ORAL_TABLET | Freq: Every day | ORAL | Status: DC
  Administered 2016-12-14: 5 mg via ORAL
  Filled 2016-12-14: qty 1

## 2016-12-14 MED ORDER — LISINOPRIL 20 MG PO TABS: 20.0000 mg | ORAL_TABLET | Freq: Every day | ORAL | 0 refills | Status: DC

## 2016-12-14 MED ORDER — IPRATROPIUM-ALBUTEROL 0.5-2.5 (3) MG/3ML IN SOLN
3.0000 mL | RESPIRATORY_TRACT | Status: DC | PRN
Start: 2016-12-14 — End: 2016-12-14

## 2016-12-14 NOTE — Care Management (Addendum)
There was no consult for CM but CM was going to assess patient due to diagnosis and had required continuous bipap upon presentation. When CM attempted to assess, found that patient had discharged.  CM found that there had been a room air overnight oximetry ordered 3/18  and that RT documented unable to perfrom on room air because 02 sat was 86%.  had oximetry on 2 liters 02 and desatted 1 hr 25 min.  Spoke with primary nurse and was informed patient was on room air and not requiring 02.  Was unaware of the overnight oximetry.  Spoke with attending and obtained order for nocturnal 02 and home health nurse.  Spoke with patient's husband and daughter and informed.  [facility] will deliver the 02 to the home and will be instructed to call patient's daughter on her cell phone with time of delivery.  Referral for home health nurse called to [facility] who can see patient 3.20.2016.  Informed Elnita MaxwellCheryl to call daughter - patient's daughter to schedule initial visit

## 2016-12-14 NOTE — Progress Notes (Signed)
Pt ambulated in hallway on r/a and tolerated it well. She is to be discharged today. Iv and tele removed. disch instructions given to pt and her daughter. To be transported by daughter.

## 2016-12-14 NOTE — Care Management Important Message (Signed)
Important Message  Patient Details  Name: Laura Barrera MRN: [id number] Date of Birth: 01/10/1936   Medicare Important Message Given:  N/A - LOS <3 / Initial given by admissions    Eber HongGreene, Davan Hark R, RN 12/14/2016, 2:23 PM

## 2016-12-14 NOTE — Discharge Summary (Signed)
[facility] [place] at [facility]   PATIENT NAME: Laura Barrera    MR#:  [id number]  DATE OF BIRTH:  June 14, 1936  DATE OF ADMISSION:  12/12/2016 ADMITTING PHYSICIAN: Marguarite Arbour, MD  DATE OF DISCHARGE: 12/14/2016  PRIMARY CARE PHYSICIAN: [facility]    ADMISSION DIAGNOSIS:  CHF (congestive heart failure) (HCC) [I50.9] SOB (shortness of breath) [R06.02] Acute on chronic congestive heart failure, unspecified congestive heart failure type (HCC) [I50.9] Acute respiratory failure (HCC) [J96.00]  DISCHARGE DIAGNOSIS:  Principal Problem:   Acute respiratory failure (HCC) Active Problems:   Acute systolic CHF (congestive heart failure) (HCC)   Dilated cardiomyopathy (HCC)   HTN (hypertension)   CHF (congestive heart failure) (HCC)     SECONDARY DIAGNOSIS:   Past Medical History:  Diagnosis Date  . Congestive heart failure (CHF) (HCC)   . Diverticulosis   . Hypertension   . Thyroid disease     HOSPITAL COURSE:   Laura Barrera a 81 y.o.femalehas a past medical history significant for cardiomyopathy with EF=35% and CHF now with worsening SOB. Some cough. No fever. Denies CP. Weight at home has been stable. In ER, pt severely tachypneic and hypoxic requiring BiPAP. CXR shows CHF   1. Acute on chronic congestive heart failure systolic with known EF of 35% by echo in February 2018 which was done as outpatient with cardiologist Dr. Vikki Ports -Patient responding very well to IV Lasix -Diuresed about 5100 cc. Change to oral Lasix  -wean oxygen to room air -Continue Coreg,, lisinopril and amlodipine- decreased dose due to low normal BP. -Overnight pulse oximetry - Daughter knows about fluid restriction, salt restriction, daily weights- she will follow with Dr. Lady Gary.  2. Hyperlipidemia Crestor  3. Hypothyroidism continue Synthroid  4. DVT prophylaxis subcutaneous Lovenox  DISCHARGE CONDITIONS:   Stable.  CONSULTS OBTAINED:   Treatment Team:  Alwyn Pea, MD Yevonne Pax, MD  DRUG ALLERGIES:   Allergies  Allergen Reactions  . Irbesartan Hives    DISCHARGE MEDICATIONS:   Current Discharge Medication List    START taking these medications   Details  aspirin EC 81 MG EC tablet Take 1 tablet (81 mg total) by mouth daily. Qty: 30 tablet, Refills: 0      CONTINUE these medications which have CHANGED   Details  amLODipine (NORVASC) 5 MG tablet Take 1 tablet (5 mg total) by mouth daily. Qty: 30 tablet, Refills: 0    lisinopril (PRINIVIL,ZESTRIL) 20 MG tablet Take 1 tablet (20 mg total) by mouth daily. Qty: 30 tablet, Refills: 0      CONTINUE these medications which have NOT CHANGED   Details  carvedilol (COREG) 6.25 MG tablet Take 6.25 mg by mouth 2 (two) times daily. Refills: 11    furosemide (LASIX) 20 MG tablet Take 20 mg by mouth daily. Refills: 1    levothyroxine (SYNTHROID, LEVOTHROID) 88 MCG tablet Take 88 mcg by mouth daily. Refills: 3    Multiple Vitamin (MULTIVITAMIN) tablet Take 1 tablet by mouth daily.    potassium chloride SA (K-DUR,KLOR-CON) 20 MEQ tablet Take 20 mEq by mouth daily.    rosuvastatin (CRESTOR) 5 MG tablet Take 5 mg by mouth daily. Refills: 3    Vitamin D, Ergocalciferol, (DRISDOL) 50000 units CAPS capsule Take 50,000 Units by mouth once a week. Refills: 3    hydrochlorothiazide (HYDRODIURIL) 12.5 MG tablet Take 12.5 mg by mouth daily. Refills: 11      STOP taking these medications  lovastatin (MEVACOR) 40 MG tablet      metoprolol (LOPRESSOR) 100 MG tablet          DISCHARGE INSTRUCTIONS:    Follow with cardiology clinic in 1 week.  If you experience worsening of your admission symptoms, develop shortness of breath, life threatening emergency, suicidal or homicidal thoughts you must seek medical attention immediately by calling 911 or calling your MD immediately  if symptoms less severe.  You Must read complete instructions/literature  along with all the possible adverse reactions/side effects for all the Medicines you take and that have been prescribed to you. Take any new Medicines after you have completely understood and accept all the possible adverse reactions/side effects.   Please note  You were cared for by a hospitalist during your hospital stay. If you have any questions about your discharge medications or the care you received while you were in the hospital after you are discharged, you can call the unit and asked to speak with the hospitalist on call if the hospitalist that took care of you is not available. Once you are discharged, your primary care physician will handle any further medical issues. Please note that NO REFILLS for any discharge medications will be authorized once you are discharged, as it is imperative that you return to your primary care physician (or establish a relationship with a primary care physician if you do not have one) for your aftercare needs so that they can reassess your need for medications and monitor your lab values.    Today   CHIEF COMPLAINT:   Chief Complaint  Patient presents with  . Shortness of Breath  . Chest Pain    HISTORY OF PRESENT ILLNESS:  Laura Barrera  is a 81 y.o. female with a known history of  cardiomyopathy with EF=35% and CHF now with worsening SOB. Some cough. No fever. Denies CP. Weight at home has been stable. In ER, pt severely tachypneic and hypoxic requiring BiPAP. CXR shows CHF and possible RLL pneumonia. BNP elevated. She is now admitted. No N/V/D. Some LE edema   VITAL SIGNS:  Blood pressure 103/67, pulse 70, temperature 98.4 F (36.9 C), temperature source Oral, resp. rate 16, height 5\' 2"  (1.575 m), weight 68.5 kg (151 lb 1.6 oz), SpO2 92 %.  I/O:   Intake/Output Summary (Last 24 hours) at 12/14/16 1320 Last data filed at 12/14/16 0800  Gross per 24 hour  Intake              240 ml  Output             1350 ml  Net            -1110 ml     PHYSICAL EXAMINATION:  GENERAL:  81 y.o.-year-old patient lying in the bed with no acute distress.  EYES: Pupils equal, round, reactive to light and accommodation. No scleral icterus. Extraocular muscles intact.  HEENT: Head atraumatic, normocephalic. Oropharynx and nasopharynx clear.  NECK:  Supple, no jugular venous distention. No thyroid enlargement, no tenderness.  LUNGS: Normal breath sounds bilaterally, no wheezing, rales,rhonchi or crepitation. No use of accessory muscles of respiration.  CARDIOVASCULAR: S1, S2 normal. No murmurs, rubs, or gallops.  ABDOMEN: Soft, non-tender, non-distended. Bowel sounds present. No organomegaly or mass.  EXTREMITIES: No pedal edema, cyanosis, or clubbing.  NEUROLOGIC: Cranial nerves II through XII are intact. Muscle strength 5/5 in all extremities. Sensation intact. Gait not checked.  PSYCHIATRIC: The patient is alert and oriented x 3.  SKIN:  No obvious rash, lesion, or ulcer.   DATA REVIEW:   CBC  Recent Labs Lab 12/13/16 0343  WBC 5.9  HGB 12.1  HCT 35.0  PLT 169    Chemistries   Recent Labs Lab 12/13/16 0343 12/14/16 0040  NA 141  --   K 3.8  --   CL 103  --   CO2 33*  --   GLUCOSE 88  --   BUN 13  --   CREATININE 0.82  --   CALCIUM 8.6*  --   MG  --  1.7  AST 36  --   ALT 21  --   ALKPHOS 48  --   BILITOT 1.1  --     Cardiac Enzymes  Recent Labs Lab 12/13/16 0343  TROPONINI 0.03*    Microbiology Results  No results found for this or any previous visit.  RADIOLOGY:  Dg Chest Port 1 View  Result Date: 12/13/2016 CLINICAL DATA:  CHF. EXAM: PORTABLE CHEST 1 VIEW COMPARISON:  12/12/2016 FINDINGS: Lungs are adequately inflated with persistent left base/ retrocardiac opacification likely effusion with atelectasis. Mild stable right base opacification likely small effusion with atelectasis. Cannot exclude infection in the lung bases. Improved perihilar markings compatible with resolving vascular congestion. Stable  cardiomegaly. Remainder of the exam is unchanged. IMPRESSION: Near resolution of previously noted vascular congestion. Stable moderate cardiomegaly. Persistent left base opacification and mild right base opacification compatible with small effusions with atelectasis left worse than right. Cannot exclude infection in the lung bases. Electronically Signed   By: Elberta Fortis M.D.   On: 12/13/2016 07:25    EKG:   Orders placed or performed during the hospital encounter of 12/12/16  . ED EKG  . ED EKG      Management plans discussed with the patient, family and they are in agreement.  CODE STATUS:     Code Status Orders        Start     Ordered   12/12/16 1541  Full code  Continuous     12/12/16 1540    Code Status History    Date Active Date Inactive Code Status Order ID Comments User Context   This patient has a current code status but no historical code status.      TOTAL TIME TAKING CARE OF THIS PATIENT: 35 minutes.    Altamese Dilling M.D on 12/14/2016 at 1:20 PM  Between 7am to 6pm - Pager - [phone]  After 6pm go to [email or web address] - Social research officer, government  [facility]  Office  [phone]  CC: Primary care physician; [facility]   Note: This dictation was prepared with Dragon dictation along with smaller phrase technology. Any transcriptional errors that result from this process are unintentional.

## 2016-12-14 NOTE — Progress Notes (Signed)
Overnight completed and placed on pt's chart 

## 2016-12-14 NOTE — Progress Notes (Signed)
Patient had 13 beats of vtach. Patient asymptomatic. MD Hugelmeyer notified. Order given for magnesium level. Will continue to monitor.

## 2016-12-22 ENCOUNTER — Telehealth: Payer: Self-pay

## 2016-12-22 NOTE — Telephone Encounter (Signed)
Spoke with the patient about coming to the clinic she [place]. She would like to speak to him before making an appointment.

## 2016-12-22 NOTE — Telephone Encounter (Signed)
-----   Message from Delma Freezeina A Hackney, [place] sent at 12/15/2016  9:11 AM EDT ----- Regarding: please call NP Contact: [phone] Discharged 12/14/16

## 2016-12-23 ENCOUNTER — Telehealth: Payer: Self-pay | Admitting: Family

## 2016-12-23 NOTE — Telephone Encounter (Signed)
Spoke with patient's daughter, Ilda BassetDonna Powell, about the HF Clinic. Ms Lowell Guitarowell says that she thinks it would be a good idea for patient to come but she needed to check her work scheduled to see when she could bring her. She says that she'll call back tomorrow to see what date/time the week of April 9th will work for all of us.

## 2017-01-02 ENCOUNTER — Emergency Department: Payer: Medicare Other

## 2017-01-02 ENCOUNTER — Inpatient Hospital Stay
Admission: EM | Admit: 2017-01-02 | Discharge: 2017-01-05 | DRG: 291 | Disposition: A | Discharge status: Home Health | Payer: Medicare Other | Attending: Internal Medicine | Admitting: Internal Medicine

## 2017-01-02 DIAGNOSIS — Z87891 Personal history of nicotine dependence: Secondary | ICD-10-CM | POA: Diagnosis not present

## 2017-01-02 DIAGNOSIS — R0902 Hypoxemia: Secondary | ICD-10-CM

## 2017-01-02 DIAGNOSIS — R0602 Shortness of breath: Secondary | ICD-10-CM

## 2017-01-02 DIAGNOSIS — E119 Type 2 diabetes mellitus without complications: Secondary | ICD-10-CM | POA: Diagnosis present

## 2017-01-02 DIAGNOSIS — Z7982 Long term (current) use of aspirin: Secondary | ICD-10-CM | POA: Diagnosis not present

## 2017-01-02 DIAGNOSIS — Z8249 Family history of ischemic heart disease and other diseases of the circulatory system: Secondary | ICD-10-CM

## 2017-01-02 DIAGNOSIS — J9601 Acute respiratory failure with hypoxia: Secondary | ICD-10-CM | POA: Diagnosis present

## 2017-01-02 DIAGNOSIS — I5023 Acute on chronic systolic (congestive) heart failure: Secondary | ICD-10-CM | POA: Diagnosis present

## 2017-01-02 DIAGNOSIS — I509 Heart failure, unspecified: Secondary | ICD-10-CM

## 2017-01-02 DIAGNOSIS — E890 Postprocedural hypothyroidism: Secondary | ICD-10-CM | POA: Diagnosis present

## 2017-01-02 DIAGNOSIS — I4510 Unspecified right bundle-branch block: Secondary | ICD-10-CM | POA: Diagnosis present

## 2017-01-02 DIAGNOSIS — Z90710 Acquired absence of both cervix and uterus: Secondary | ICD-10-CM | POA: Diagnosis not present

## 2017-01-02 DIAGNOSIS — I110 Hypertensive heart disease with heart failure: Secondary | ICD-10-CM | POA: Diagnosis present

## 2017-01-02 DIAGNOSIS — E785 Hyperlipidemia, unspecified: Secondary | ICD-10-CM | POA: Diagnosis present

## 2017-01-02 DIAGNOSIS — I5020 Unspecified systolic (congestive) heart failure: Secondary | ICD-10-CM

## 2017-01-02 DIAGNOSIS — Z888 Allergy status to other drugs, medicaments and biological substances status: Secondary | ICD-10-CM | POA: Diagnosis not present

## 2017-01-02 DIAGNOSIS — Z79899 Other long term (current) drug therapy: Secondary | ICD-10-CM

## 2017-01-02 LAB — COMPREHENSIVE METABOLIC PANEL
ALT: 39 U/L (ref 14–54)
AST: 59 U/L — ABNORMAL HIGH (ref 15–41)
Albumin: 3.6 g/dL (ref 3.5–5.0)
Alkaline Phosphatase: 56 U/L (ref 38–126)
Anion gap: 5 (ref 5–15)
BILIRUBIN TOTAL: 1.3 mg/dL — AB (ref 0.3–1.2)
BUN: 26 mg/dL — ABNORMAL HIGH (ref 6–20)
CALCIUM: 9.1 mg/dL (ref 8.9–10.3)
CO2: 30 mmol/L (ref 22–32)
CREATININE: 0.97 mg/dL (ref 0.44–1.00)
Chloride: 104 mmol/L (ref 101–111)
GFR, EST NON AFRICAN AMERICAN: 55 mL/min — AB (ref >60)
Glucose, Bld: 196 mg/dL — ABNORMAL HIGH (ref 65–99)
Potassium: 4.9 mmol/L (ref 3.5–5.1)
Sodium: 139 mmol/L (ref 135–145)
TOTAL PROTEIN: 6.9 g/dL (ref 6.5–8.1)

## 2017-01-02 LAB — CBC WITH DIFFERENTIAL/PLATELET
Basophils Absolute: 0.1 10*3/uL (ref 0–0.1)
Basophils Relative: 1 %
Eosinophils Absolute: 0.1 10*3/uL (ref 0–0.7)
Eosinophils Relative: 1 %
HEMATOCRIT: 37.6 % (ref 35.0–47.0)
Hemoglobin: 12.6 g/dL (ref 12.0–16.0)
LYMPHS PCT: 25 %
Lymphs Abs: 2.1 10*3/uL (ref 1.0–3.6)
MCH: 32.9 pg (ref 26.0–34.0)
MCHC: 33.5 g/dL (ref 32.0–36.0)
MCV: 98 fL (ref 80.0–100.0)
MONO ABS: 0.5 10*3/uL (ref 0.2–0.9)
MONOS PCT: 5 %
NEUTROS ABS: 5.8 10*3/uL (ref 1.4–6.5)
NEUTROS PCT: 68 %
Platelets: 235 10*3/uL (ref 150–440)
RBC: 3.84 MIL/uL (ref 3.80–5.20)
RDW: 14.9 % — ABNORMAL HIGH (ref 11.5–14.5)
WBC: 8.4 10*3/uL (ref 3.6–11.0)

## 2017-01-02 LAB — GLUCOSE, CAPILLARY
GLUCOSE-CAPILLARY: 116 mg/dL — AB (ref 65–99)
Glucose-Capillary: 142 mg/dL — ABNORMAL HIGH (ref 65–99)
Glucose-Capillary: 144 mg/dL — ABNORMAL HIGH (ref 65–99)

## 2017-01-02 LAB — TROPONIN I: Troponin I: <0.03 ng/mL (ref <0.03)

## 2017-01-02 LAB — BRAIN NATRIURETIC PEPTIDE: B Natriuretic Peptide: 2594 pg/mL — ABNORMAL HIGH (ref 0.0–100.0)

## 2017-01-02 LAB — LACTIC ACID, PLASMA: LACTIC ACID, VENOUS: 1.3 mmol/L (ref 0.5–1.9)

## 2017-01-02 MED ORDER — FUROSEMIDE 10 MG/ML IJ SOLN
40.0000 mg | Freq: Once | INTRAMUSCULAR | Status: AC
  Administered 2017-01-02: 40 mg via INTRAVENOUS
  Filled 2017-01-02: qty 4

## 2017-01-02 MED ORDER — AMLODIPINE BESYLATE 5 MG PO TABS: 5.0000 mg | ORAL_TABLET | Freq: Every day | ORAL | Status: DC

## 2017-01-02 MED ORDER — ONDANSETRON HCL 4 MG PO TABS: 4.0000 mg | ORAL_TABLET | Freq: Four times a day (QID) | ORAL | Status: DC | PRN

## 2017-01-02 MED ORDER — LEVOTHYROXINE SODIUM 88 MCG PO TABS
88.0000 ug | ORAL_TABLET | Freq: Every day | ORAL | Status: DC
  Administered 2017-01-03 – 2017-01-05 (×3): 88 ug via ORAL
  Filled 2017-01-02 (×4): qty 1

## 2017-01-02 MED ORDER — POLYETHYLENE GLYCOL 3350 17 G PO PACK: 17.0000 g | PACK | Freq: Every day | ORAL | Status: DC | PRN

## 2017-01-02 MED ORDER — ASPIRIN EC 81 MG PO TBEC
81.0000 mg | DELAYED_RELEASE_TABLET | Freq: Every day | ORAL | Status: DC
  Administered 2017-01-02 – 2017-01-05 (×4): 81 mg via ORAL
  Filled 2017-01-02 (×4): qty 1

## 2017-01-02 MED ORDER — ACETAMINOPHEN 650 MG RE SUPP: 650.0000 mg | Freq: Four times a day (QID) | RECTAL | Status: DC | PRN

## 2017-01-02 MED ORDER — ONDANSETRON HCL 4 MG/2ML IJ SOLN: 4.0000 mg | Freq: Four times a day (QID) | INTRAMUSCULAR | Status: DC | PRN

## 2017-01-02 MED ORDER — ROSUVASTATIN CALCIUM 10 MG PO TABS
5.0000 mg | ORAL_TABLET | Freq: Every day | ORAL | Status: DC
  Administered 2017-01-02 – 2017-01-04 (×3): 5 mg via ORAL
  Filled 2017-01-02: qty 1
  Filled 2017-01-02: qty 2
  Filled 2017-01-02: qty 1

## 2017-01-02 MED ORDER — INSULIN ASPART 100 UNIT/ML ~~LOC~~ SOLN: 0.0000 [IU] | Freq: Every day | SUBCUTANEOUS | Status: DC

## 2017-01-02 MED ORDER — INSULIN ASPART 100 UNIT/ML ~~LOC~~ SOLN
0.0000 [IU] | Freq: Three times a day (TID) | SUBCUTANEOUS | Status: DC
  Administered 2017-01-02: 1 [IU] via SUBCUTANEOUS
  Filled 2017-01-02: qty 1

## 2017-01-02 MED ORDER — ENOXAPARIN SODIUM 40 MG/0.4ML ~~LOC~~ SOLN
40.0000 mg | SUBCUTANEOUS | Status: DC
  Administered 2017-01-02 – 2017-01-04 (×3): 40 mg via SUBCUTANEOUS
  Filled 2017-01-02 (×3): qty 0.4

## 2017-01-02 MED ORDER — ACETAMINOPHEN 325 MG PO TABS
650.0000 mg | ORAL_TABLET | Freq: Four times a day (QID) | ORAL | Status: DC | PRN
Start: 2017-01-02 — End: 2017-01-05
  Administered 2017-01-02: 650 mg via ORAL

## 2017-01-02 MED ORDER — FUROSEMIDE 10 MG/ML IJ SOLN
40.0000 mg | Freq: Three times a day (TID) | INTRAMUSCULAR | Status: AC
  Administered 2017-01-02 – 2017-01-03 (×3): 40 mg via INTRAVENOUS
  Filled 2017-01-02 (×3): qty 4

## 2017-01-02 MED ORDER — SODIUM CHLORIDE 0.9% FLUSH
3.0000 mL | Freq: Two times a day (BID) | INTRAVENOUS | Status: DC
  Administered 2017-01-02 – 2017-01-05 (×6): 3 mL via INTRAVENOUS

## 2017-01-02 MED ORDER — POTASSIUM CHLORIDE CRYS ER 20 MEQ PO TBCR
40.0000 meq | EXTENDED_RELEASE_TABLET | Freq: Every day | ORAL | Status: DC
  Administered 2017-01-02 – 2017-01-05 (×4): 40 meq via ORAL
  Filled 2017-01-02 (×4): qty 2

## 2017-01-02 MED ORDER — ALBUTEROL SULFATE (2.5 MG/3ML) 0.083% IN NEBU: 2.5000 mg | INHALATION_SOLUTION | RESPIRATORY_TRACT | Status: DC | PRN

## 2017-01-02 MED ORDER — CARVEDILOL 6.25 MG PO TABS
6.2500 mg | ORAL_TABLET | Freq: Two times a day (BID) | ORAL | Status: DC
  Administered 2017-01-02 – 2017-01-05 (×7): 6.25 mg via ORAL
  Filled 2017-01-02 (×7): qty 1

## 2017-01-02 MED ORDER — LISINOPRIL 20 MG PO TABS
20.0000 mg | ORAL_TABLET | Freq: Every day | ORAL | Status: DC
  Administered 2017-01-02 – 2017-01-05 (×4): 20 mg via ORAL
  Filled 2017-01-02 (×4): qty 1

## 2017-01-02 NOTE — ED Provider Notes (Signed)
[facility] Emergency Department Provider Note ____________________________________________   I have reviewed the triage vital signs and the triage nursing note.  HISTORY  Chief Complaint Respiratory Distress   Historian History limited as patient is in respiratory distress. History obtained from EMS who brought her in, and daughter.  HPI Laura Barrera is a 81 y.o. female with a history of CHF, recent exacerbation, history of dilated cardiomyopathy, hypertensionas reviewed in the chart documentation, unclear if she has a history of COPD, EMS was called out to her home due to trouble breathing. Apparently she is wearing 2 or 3 L home oxygen. Patient had decreased breath sounds and wheezing in the upper lung fields and was given Solu-Medrol, DuoNeb, and placed on BiPAP which helped some. Off oxygen O2 sat dropped to low 70s on room air. She reports some mild chest pressure without chest pain. No pleuritic chest pain. She shakes her head no regarding questioning about fevers.    Past Medical History:  Diagnosis Date  . Congestive heart failure (CHF) (HCC)   . Diverticulosis   . Hypertension   . Thyroid disease     Patient Active Problem List   Diagnosis Date Noted  . Acute respiratory failure (HCC) 12/12/2016  . Acute systolic CHF (congestive heart failure) (HCC) 12/12/2016  . Dilated cardiomyopathy (HCC) 12/12/2016  . HTN (hypertension) 12/12/2016  . CHF (congestive heart failure) (HCC) 12/12/2016    Past Surgical History:  Procedure Laterality Date  . ABDOMINAL HYSTERECTOMY    . COLONOSCOPY    . THYROIDECTOMY      Prior to Admission medications   Medication Sig Start Date End Date Taking? Authorizing Provider  amLODipine (NORVASC) 5 MG tablet Take 1 tablet (5 mg total) by mouth daily. 12/15/16   Altamese Dilling, MD  aspirin EC 81 MG EC tablet Take 1 tablet (81 mg total) by mouth daily. 12/15/16   Altamese Dilling, MD  carvedilol (COREG)  6.25 MG tablet Take 6.25 mg by mouth 2 (two) times daily. 11/16/16   Historical Provider, MD  furosemide (LASIX) 20 MG tablet Take 20 mg by mouth daily. 11/18/16   Historical Provider, MD  hydrochlorothiazide (HYDRODIURIL) 12.5 MG tablet Take 12.5 mg by mouth daily. 11/04/16   Historical Provider, MD  levothyroxine (SYNTHROID, LEVOTHROID) 88 MCG tablet Take 88 mcg by mouth daily. 11/28/16   Historical Provider, MD  lisinopril (PRINIVIL,ZESTRIL) 20 MG tablet Take 1 tablet (20 mg total) by mouth daily. 12/15/16   Altamese Dilling, MD  Multiple Vitamin (MULTIVITAMIN) tablet Take 1 tablet by mouth daily.    Historical Provider, MD  potassium chloride SA (K-DUR,KLOR-CON) 20 MEQ tablet Take 20 mEq by mouth daily. 12/09/16   Historical Provider, MD  rosuvastatin (CRESTOR) 5 MG tablet Take 5 mg by mouth daily. 11/12/16   Historical Provider, MD  Vitamin D, Ergocalciferol, (DRISDOL) 50000 units CAPS capsule Take 50,000 Units by mouth once a week. 12/03/16   Historical Provider, MD    Allergies  Allergen Reactions  . Irbesartan Hives    No family history on file.  Social History Social History  Substance Use Topics  . Smoking status: Former Games developer  . Smokeless tobacco: Never Used  . Alcohol use Not on file    Review of Systems  Limited due to respiratory distress, however patient does report positive for chest pressure, positive for shortness of breath, negative for chest pain. Negative for nausea vomiting or diarrhea.  ____________________________________________   PHYSICAL EXAM:  VITAL SIGNS: ED Triage Vitals  Enc Vitals Group     BP 01/02/17 0715 (!) 150/95     Pulse Rate 01/02/17 0715 97     Resp 01/02/17 0715 (!) 22     Temp 01/02/17 0715 97.9 F (36.6 C)     Temp Source 01/02/17 0715 Oral     SpO2 01/02/17 0715 98 %     Weight 01/02/17 0711 150 lb (68 kg)     Height 01/02/17 0711  (1.575 m)     Head Circumference --      Peak Flow --      Pain Score --      Pain Loc --       Pain Edu? --      Excl. in GC? --      Constitutional: Alert and cooperative. Tachypneic and wearing bipap HEENT   Head: Normocephalic and atraumatic.      Eyes: Conjunctivae are normal. PERRL. Normal extraocular movements.      Ears:         Nose: No congestion/rhinnorhea.   Mouth/Throat: Mucous membranes are moist.   Neck: No stridor. Cardiovascular/Chest: Normal rate, regular rhythm.  No murmurs, rubs, or gallops. Respiratory:  Tachypnea with retractions. Decreased breath sounds throughout lower lungs, mild rhonchi. No wheezing. Gastrointestinal: Soft. No distention, no guarding, no rebound. Nontender.    Genitourinary/rectal:Deferred Musculoskeletal: Nontender with normal range of motion in all extremities. No joint effusions.  No lower extremity tenderness.  2+ le edema bilaterally Neurologic:  Normal speech and language. No gross or focal neurologic deficits are appreciated. Skin:  Skin is warm, dry and intact. No rash noted. Psychiatric: Mood and affect are normal. Speech and behavior are normal. Patient exhibits appropriate insight and judgment.   ____________________________________________  LABS (pertinent positives/negatives)  Labs Reviewed  CULTURE, BLOOD (ROUTINE X 2)  CULTURE, BLOOD (ROUTINE X 2)  CBC WITH DIFFERENTIAL/PLATELET  TROPONIN I  COMPREHENSIVE METABOLIC PANEL  BRAIN NATRIURETIC PEPTIDE  LACTIC ACID, PLASMA  LACTIC ACID, PLASMA    ____________________________________________    EKG I, Governor Rooks, MD, the attending physician have personally viewed and interpreted all ECGs.  94 bpm. normal sinus rhythm. Nonspecific intraventricular conduction delay. Normal axis. Nonspecific ST and T-wave ____________________________________________  RADIOLOGY All Xrays were viewed by me. Imaging interpreted by Radiologist.  Chest x-ray one view portable:  FINDINGS: The heart is enlarged but stable. Persistent tortuosity, ectasia  and calcification of the thoracic aorta. The lungs demonstrate pulmonary edema and small pleural effusions. The bony thorax is intact.  IMPRESSION: CHF. __________________________________________  PROCEDURES  Procedure(s) performed: None  Critical Care performed: CRITICAL CARE Performed by: Governor Rooks   Total critical care time: 30 minutes  Critical care time was exclusive of separately billable procedures and treating other patients.  Critical care was necessary to treat or prevent imminent or life-threatening deterioration.  Critical care was time spent personally by me on the following activities: development of treatment plan with patient and/or surrogate as well as nursing, discussions with consultants, evaluation of patient's response to treatment, examination of patient, obtaining history from patient or surrogate, ordering and performing treatments and interventions, ordering and review of laboratory studies, ordering and review of radiographic studies, pulse oximetry and re-evaluation of patient's condition.   ____________________________________________   ED COURSE / ASSESSMENT AND PLAN  Pertinent labs & imaging results that were available during my care of the patient were reviewed by me and considered in my medical decision making (see chart for details).   When patient was moved  from EMS BiPAP to our BiPAP, off BiPAP she dropped precipitously into the low 70s on room air. Clinically I am most suspicious for CHF.  Less suspicious for pneumonia.  CXr without infiltrate, no elevated wbc (slight left shift), no fever, no elevated lactate.  Blood cultures sent but no antibiotics at this point.  Started IV lasix for diuresis. Discussed with hospitalist for admission.    CONSULTATIONS:  Hospitalist for admission.  Patient / Family / Caregiver informed of clinical course, medical decision-making process, and agree with  plan.     ___________________________________________   FINAL CLINICAL IMPRESSION(S) / ED DIAGNOSES   Final diagnoses:  Hypoxia  Acute congestive heart failure, unspecified congestive heart failure type [facility])              Note: This dictation was prepared with Dragon dictation. Any transcriptional errors that result from this process are unintentional    Governor Rooks, MD 01/02/17 [phone]

## 2017-01-02 NOTE — Progress Notes (Signed)
Pt adm to room 658 via stretcher and ED nurse.  Daughter with pt at bedside.  Skin intact and a small pin size hole noted at the sacrum area.  A prophylactic dressing applied to the area.Pt alert and oriented x4, no complaints of pain or discomfort.  Bed in low position, call bell within reach.  Bed alarms on and functioning.  Assessment done and charted.  Will continue to monitor and do hourly rounding throughout the shift

## 2017-01-02 NOTE — ED Triage Notes (Signed)
Pt came to ED via EMS from home in respiratory distress. History of CHF, woke up this morning and could not get up out of bed without feeling sob. o2 88% r/a. EMS placed pt on cpap and was satting 96%. Afebrile. BP stable. Given 1 duoneb, 1 albuterol,  solumedrol w/ EMS.

## 2017-01-02 NOTE — ED Notes (Signed)
Pt taken off bipap and placed on [place]. Tolerating it well.

## 2017-01-02 NOTE — H&P (Signed)
[employer] [facility] - [place] at [facility]   PATIENT NAME: Laura Barrera    MR#:  [id number]  DATE OF BIRTH:  March 25, 1936  DATE OF ADMISSION:  01/02/2017  PRIMARY CARE PHYSICIAN: [facility]   REQUESTING/REFERRING PHYSICIAN: Dr. Shaune Pollack  CHIEF COMPLAINT:   Chief Complaint  Patient presents with  . Respiratory Distress    HISTORY OF PRESENT ILLNESS:  Laura Barrera  is a 81 y.o. female with a known history of systolic chf - EF 30%, HTN, DM here with worsening SOB since night. Here patient found to be extremily SOB and started on Bipap. CXR showing pulm edema and pleural effusions. Has LE edema Cough, SOB, orthopnea.  PAST MEDICAL HISTORY:   Past Medical History:  Diagnosis Date  . Congestive heart failure (CHF) (HCC)   . Diverticulosis   . Hypertension   . Thyroid disease     PAST SURGICAL HISTORY:   Past Surgical History:  Procedure Laterality Date  . ABDOMINAL HYSTERECTOMY    . COLONOSCOPY    . THYROIDECTOMY      SOCIAL HISTORY:   Social History  Substance Use Topics  . Smoking status: Former Games developer  . Smokeless tobacco: Never Used  . Alcohol use Not on file    FAMILY HISTORY:   Family History  Problem Relation Age of Onset  . Hypertension Other     DRUG ALLERGIES:   Allergies  Allergen Reactions  . Irbesartan Hives    REVIEW OF SYSTEMS:   Review of Systems  Constitutional: Positive for malaise/fatigue. Negative for chills, fever and weight loss.  HENT: Negative for hearing loss and nosebleeds.   Eyes: Negative for blurred vision, double vision and pain.  Respiratory: Positive for cough and shortness of breath. Negative for hemoptysis, sputum production and wheezing.   Cardiovascular: Positive for orthopnea and leg swelling. Negative for chest pain and palpitations.  Gastrointestinal: Negative for abdominal pain, constipation, diarrhea, nausea and vomiting.  Genitourinary: Negative for dysuria and hematuria.  Musculoskeletal:  Negative for back pain, falls and myalgias.  Skin: Negative for rash.  Neurological: Negative for dizziness, tremors, sensory change, speech change, focal weakness, seizures and headaches.  Endo/Heme/Allergies: Does not bruise/bleed easily.  Psychiatric/Behavioral: Negative for depression and memory loss. The patient is not nervous/anxious.     MEDICATIONS AT HOME:   Prior to Admission medications   Medication Sig Start Date End Date Taking? Authorizing Provider  amLODipine (NORVASC) 5 MG tablet Take 1 tablet (5 mg total) by mouth daily. 12/15/16  Yes Altamese Dilling, MD  aspirin EC 81 MG EC tablet Take 1 tablet (81 mg total) by mouth daily. 12/15/16  Yes Altamese Dilling, MD  carvedilol (COREG) 6.25 MG tablet Take 6.25 mg by mouth 2 (two) times daily. 11/16/16  Yes Historical Provider, MD  furosemide (LASIX) 20 MG tablet Take 20 mg by mouth daily. 11/18/16  Yes Historical Provider, MD  hydrochlorothiazide (HYDRODIURIL) 12.5 MG tablet Take 12.5 mg by mouth daily. 11/04/16  Yes Historical Provider, MD  levothyroxine (SYNTHROID, LEVOTHROID) 88 MCG tablet Take 88 mcg by mouth daily. 11/28/16  Yes Historical Provider, MD  lisinopril (PRINIVIL,ZESTRIL) 20 MG tablet Take 1 tablet (20 mg total) by mouth daily. 12/15/16  Yes Altamese Dilling, MD  Multiple Vitamin (MULTIVITAMIN) tablet Take 1 tablet by mouth daily.   Yes Historical Provider, MD  potassium chloride SA (K-DUR,KLOR-CON) 20 MEQ tablet Take 20 mEq by mouth daily. 12/09/16  Yes Historical Provider, MD  rosuvastatin (CRESTOR) 5 MG tablet Take 5 mg  by mouth daily. 11/12/16  Yes Historical Provider, MD  Vitamin D, Ergocalciferol, (DRISDOL) 50000 units CAPS capsule Take 50,000 Units by mouth once a week. 12/03/16  Yes Historical Provider, MD     VITAL SIGNS:  Blood pressure 112/77, pulse 78, temperature 97.9 F (36.6 C), temperature source Oral, resp. rate 11, height  (1.575 m), weight 68 kg (150 lb), SpO2 100 %.  PHYSICAL  EXAMINATION:  Physical Exam  GENERAL:  81 y.o.-year-old patient lying in the bed with no acute distress.  EYES: Pupils equal, round, reactive to light and accommodation. No scleral icterus. Extraocular muscles intact.  HEENT: Head atraumatic, normocephalic. Oropharynx and nasopharynx clear. No oropharyngeal erythema, moist oral mucosa  NECK:  Supple, no jugular venous distention. No thyroid enlargement, no tenderness.  LUNGS: Decreased air entry at bases CARDIOVASCULAR: S1, S2 normal. No murmurs, rubs, or gallops.  ABDOMEN: Soft, nontender, nondistended. Bowel sounds present. No organomegaly or mass.  EXTREMITIES: No pedal edema, cyanosis, or clubbing. + 2 pedal & radial pulses b/l.   NEUROLOGIC: Cranial nerves II through XII are intact. No focal Motor or sensory deficits appreciated b/l PSYCHIATRIC: The patient is alert and oriented x 3. Good affect.  SKIN: No obvious rash, lesion, or ulcer.   LABORATORY PANEL:   CBC  Recent Labs Lab 01/02/17 0718  WBC 8.4  HGB 12.6  HCT 37.6  PLT 235   ------------------------------------------------------------------------------------------------------------------  Chemistries   Recent Labs Lab 01/02/17 0718  NA 139  K 4.9  CL 104  CO2 30  GLUCOSE 196*  BUN 26*  CREATININE 0.97  CALCIUM 9.1  AST 59*  ALT 39  ALKPHOS 56  BILITOT 1.3*   ------------------------------------------------------------------------------------------------------------------  Cardiac Enzymes  Recent Labs Lab 01/02/17 0718  TROPONINI <0.03   ------------------------------------------------------------------------------------------------------------------  RADIOLOGY:  Dg Chest 1 View  Result Date: 01/02/2017 CLINICAL DATA:  Respiratory distress. EXAM: CHEST 1 VIEW COMPARISON:  12/13/2016 FINDINGS: The heart is enlarged but stable. Persistent tortuosity, ectasia and calcification of the thoracic aorta. The lungs demonstrate pulmonary edema and small  pleural effusions. The bony thorax is intact. IMPRESSION: CHF. Electronically Signed   By: Rudie Meyer M.D.   On: 01/02/2017 08:14     IMPRESSION AND PLAN:   * Acute on chronic systolic chf - IV Lasix, Beta blockers - Input and Output - Counseled to limit fluids and Salt - Monitor Bun/Cr and Potassium - Echo - reviewed -Cardiology follow up after discharge  * HTN Continue home meds  * DM Received solumedrol with EMS SSI  * DVT prophylaxis Lovenox   All the records are reviewed and case discussed with ED provider. Management plans discussed with the patient, family and they are in agreement.  CODE STATUS: FULL CODE  TOTAL TIME TAKING CARE OF THIS PATIENT: 40 minutes.   Milagros Loll R M.D on 01/02/2017 at 9:37 AM  Between 7am to 6pm - Pager - [phone]  After 6pm go to [email or web address] - password [email or web address]  [facility] [place] [facility]  Office  [phone]  CC: Primary care physician; [facility]  Note: This dictation was prepared with Dragon dictation along with smaller phrase technology. Any transcriptional errors that result from this process are unintentional.

## 2017-01-03 LAB — BASIC METABOLIC PANEL
ANION GAP: 7 (ref 5–15)
BUN: 33 mg/dL — ABNORMAL HIGH (ref 6–20)
CHLORIDE: 100 mmol/L — AB (ref 101–111)
CO2: 32 mmol/L (ref 22–32)
CREATININE: 1.06 mg/dL — AB (ref 0.44–1.00)
Calcium: 9.3 mg/dL (ref 8.9–10.3)
GFR calc non Af Amer: 49 mL/min — ABNORMAL LOW (ref >60)
GFR, EST AFRICAN AMERICAN: 57 mL/min — AB (ref >60)
Glucose, Bld: 108 mg/dL — ABNORMAL HIGH (ref 65–99)
Potassium: 3.9 mmol/L (ref 3.5–5.1)
Sodium: 139 mmol/L (ref 135–145)

## 2017-01-03 LAB — GLUCOSE, CAPILLARY
GLUCOSE-CAPILLARY: 95 mg/dL (ref 65–99)
GLUCOSE-CAPILLARY: 100 mg/dL — AB (ref 65–99)
Glucose-Capillary: 95 mg/dL (ref 65–99)
Glucose-Capillary: 98 mg/dL (ref 65–99)

## 2017-01-03 NOTE — Progress Notes (Signed)
[facility] - [place] at [facility]   PATIENT NAME: Laura Barrera    MR#:  [id number]  DATE OF BIRTH:  1936/07/09  SUBJECTIVE:  CHIEF COMPLAINT:   Chief Complaint  Patient presents with  . Respiratory Distress   SOB better. Still on 4 L O2 LE edema improving   REVIEW OF SYSTEMS:    Review of Systems  Constitutional: Positive for malaise/fatigue. Negative for chills and fever.  HENT: Negative for sore throat.   Eyes: Negative for blurred vision, double vision and pain.  Respiratory: Positive for cough and shortness of breath. Negative for hemoptysis and wheezing.   Cardiovascular: Negative for chest pain, palpitations, orthopnea and leg swelling.  Gastrointestinal: Negative for abdominal pain, constipation, diarrhea, heartburn, nausea and vomiting.  Genitourinary: Negative for dysuria and hematuria.  Musculoskeletal: Negative for back pain and joint pain.  Skin: Negative for rash.  Neurological: Positive for weakness. Negative for sensory change, speech change, focal weakness and headaches.  Endo/Heme/Allergies: Does not bruise/bleed easily.  Psychiatric/Behavioral: Negative for depression. The patient is not nervous/anxious.     DRUG ALLERGIES:   Allergies  Allergen Reactions  . Irbesartan Hives    VITALS:  Blood pressure 109/69, pulse 73, temperature 97.6 F (36.4 C), temperature source Oral, resp. rate 18, height  (1.676 m), weight 65 kg (143 lb 4.8 oz), SpO2 96 %.  PHYSICAL EXAMINATION:   Physical Exam  GENERAL:  81 y.o.-year-old patient lying in the bed with no acute distress.  EYES: Pupils equal, round, reactive to light and accommodation. No scleral icterus. Extraocular muscles intact.  HEENT: Head atraumatic, normocephalic. Oropharynx and nasopharynx clear.  NECK:  Supple, no jugular venous distention. No thyroid enlargement, no tenderness.  LUNGS: Bibasilar crackles CARDIOVASCULAR: S1, S2 normal. No murmurs, rubs, or gallops.  ABDOMEN:  Soft, nontender, nondistended. Bowel sounds present. No organomegaly or mass.  EXTREMITIES: No cyanosis, clubbing or edema b/l.    NEUROLOGIC: Cranial nerves II through XII are intact. No focal Motor or sensory deficits b/l.   PSYCHIATRIC: The patient is alert and oriented x 3.  SKIN: No obvious rash, lesion, or ulcer.   LABORATORY PANEL:   CBC  Recent Labs Lab 01/02/17 0718  WBC 8.4  HGB 12.6  HCT 37.6  PLT 235   ------------------------------------------------------------------------------------------------------------------ Chemistries   Recent Labs Lab 01/02/17 0718 01/03/17 0618  NA 139 139  K 4.9 3.9  CL 104 100*  CO2 30 32  GLUCOSE 196* 108*  BUN 26* 33*  CREATININE 0.97 1.06*  CALCIUM 9.1 9.3  AST 59*  --   ALT 39  --   ALKPHOS 56  --   BILITOT 1.3*  --    ------------------------------------------------------------------------------------------------------------------  Cardiac Enzymes  Recent Labs Lab 01/02/17 0718  TROPONINI <0.03   ------------------------------------------------------------------------------------------------------------------  RADIOLOGY:  Dg Chest 1 View  Result Date: 01/02/2017 CLINICAL DATA:  Respiratory distress. EXAM: CHEST 1 VIEW COMPARISON:  12/13/2016 FINDINGS: The heart is enlarged but stable. Persistent tortuosity, ectasia and calcification of the thoracic aorta. The lungs demonstrate pulmonary edema and small pleural effusions. The bony thorax is intact. IMPRESSION: CHF. Electronically Signed   By: Rudie Meyer M.D.   On: 01/02/2017 08:14     ASSESSMENT AND PLAN:   * Acute on chronic systolic chf with acute hypoxic respiratory failure POA - IV Lasix, Beta blockers - Input and Output - Counseled to limit fluids and Salt - Monitor Bun/Cr and Potassium - Echo - reviewed 35-40% EF -Cardiology follow up after discharge  Discussed with family at bedside. Likely d/c in AM  * HTN Continue home meds  *  DM Received solumedrol with EMS SSI  * DVT prophylaxis Lovenox  All the records are reviewed and case discussed with Care Management/Social Worker Management plans discussed with the patient, family and they are in agreement.  CODE STATUS: FULL CODE  DVT Prophylaxis: SCDs  TOTAL TIME TAKING CARE OF THIS PATIENT: 30 minutes.   POSSIBLE D/C IN 1-2 DAYS, DEPENDING ON CLINICAL CONDITION.  Milagros Loll R M.D on 01/03/2017 at 12:40 PM  Between 7am to 6pm - Pager - [phone]  After 6pm go to [email or web address] - password [email or web address]  [facility] [place] [facility]  Office  [phone]  CC: Primary care physician; [facility]  Note: This dictation was prepared with Dragon dictation along with smaller phrase technology. Any transcriptional errors that result from this process are unintentional.

## 2017-01-04 ENCOUNTER — Inpatient Hospital Stay: Payer: Medicare Other

## 2017-01-04 LAB — BASIC METABOLIC PANEL
Anion gap: 4 — ABNORMAL LOW (ref 5–15)
BUN: 33 mg/dL — AB (ref 6–20)
CALCIUM: 8.8 mg/dL — AB (ref 8.9–10.3)
CO2: 31 mmol/L (ref 22–32)
Chloride: 103 mmol/L (ref 101–111)
Creatinine, Ser: 1 mg/dL (ref 0.44–1.00)
GFR calc Af Amer: >60 mL/min (ref >60)
GFR, EST NON AFRICAN AMERICAN: 53 mL/min — AB (ref >60)
GLUCOSE: 85 mg/dL (ref 65–99)
Potassium: 3.7 mmol/L (ref 3.5–5.1)
SODIUM: 138 mmol/L (ref 135–145)

## 2017-01-04 LAB — GLUCOSE, CAPILLARY
GLUCOSE-CAPILLARY: 85 mg/dL (ref 65–99)
GLUCOSE-CAPILLARY: 95 mg/dL (ref 65–99)
GLUCOSE-CAPILLARY: 88 mg/dL (ref 65–99)
Glucose-Capillary: 119 mg/dL — ABNORMAL HIGH (ref 65–99)

## 2017-01-04 LAB — MAGNESIUM: MAGNESIUM: 1.8 mg/dL (ref 1.7–2.4)

## 2017-01-04 MED ORDER — FUROSEMIDE 10 MG/ML IJ SOLN
20.0000 mg | Freq: Two times a day (BID) | INTRAMUSCULAR | Status: DC
  Administered 2017-01-04 – 2017-01-05 (×2): 20 mg via INTRAVENOUS
  Filled 2017-01-04 (×2): qty 2

## 2017-01-04 MED ORDER — IOPAMIDOL (ISOVUE-370) INJECTION 76%
75.0000 mL | Freq: Once | INTRAVENOUS | Status: AC | PRN
  Administered 2017-01-04: 75 mL via INTRAVENOUS

## 2017-01-04 MED ORDER — FUROSEMIDE 20 MG PO TABS: 20.0000 mg | ORAL_TABLET | Freq: Two times a day (BID) | ORAL | 0 refills | Status: DC

## 2017-01-04 NOTE — Progress Notes (Signed)
SATURATION QUALIFICATIONS: (This note is used to comply with regulatory documentation for home oxygen)  Patient Saturations on Room Air at Rest = 84%  Patient Saturations on Room Air while Ambulating = %  Patient Saturations on  Liters of oxygen while Ambulating = %  Please briefly explain why patient needs home oxygen: 

## 2017-01-04 NOTE — Care Management (Signed)
Patient is currently followed by [facility] nursing.  she has qualified for continuous oxygen.  At last discharge, she qualified for nocturnal.  Notified [facility] who is providing the nocturnal 02.

## 2017-01-04 NOTE — Discharge Instructions (Signed)
Heart Failure Clinic appointment on January 11, 2017 at 11:20am with Clarisa Kindred, FNP. Please call [phone] to reschedule.

## 2017-01-04 NOTE — Discharge Summary (Signed)
[employer] - [place] at [facility]   PATIENT NAME: Laura Barrera    MR#:  [id number]  DATE OF BIRTH:  1935-11-14  DATE OF ADMISSION:  01/02/2017 ADMITTING PHYSICIAN: Milagros Loll, MD  DATE OF DISCHARGE: 01/05/2017  PRIMARY CARE PHYSICIAN: [facility]    ADMISSION DIAGNOSIS:  Hypoxia [R09.02] Acute congestive heart failure, unspecified congestive heart failure type (HCC) [I50.9]  DISCHARGE DIAGNOSIS:  Active Problems:   CHF (congestive heart failure) (HCC)   SECONDARY DIAGNOSIS:   Past Medical History:  Diagnosis Date  . Congestive heart failure (CHF) (HCC)   . Diverticulosis   . Hypertension   . Thyroid disease     HOSPITAL COURSE:   81 year old female with chronic systolic heart failure ejection fraction 35-40% who presented with shortness of breath and acute hypoxic respiratory failure.  1. Acute on chronic systolic heart failure with EF of 35-40% by echo in March with acute hypoxic respiratory failure Patient will require 24 hours of oxygen. She currently wears 2 L of oxygen at night. She has been diuresed and is euvolemic. Patient will continue Coreg, lisinopril and Lasix. She has referral for CHF clinic She underwent CT scan to evaluate PE which was negative for PE.  2. Essential hypertension: Patient will continue HCTZ, Coreg and lisinopril and Norvasc  3. Hypothyroidism: Continue Synthroid 4. Hyperlipidemia: Continue statin DISCHARGE CONDITIONS AND DIET:   Stable cardiac diet  CONSULTS OBTAINED:    DRUG ALLERGIES:   Allergies  Allergen Reactions  . Irbesartan Hives    DISCHARGE MEDICATIONS:   Current Discharge Medication List    CONTINUE these medications which have CHANGED   Details  furosemide (LASIX) 20 MG tablet Take 1 tablet (20 mg total) by mouth 2 (two) times daily. Qty: 60 tablet, Refills: 0      CONTINUE these medications which have NOT CHANGED   Details  amLODipine (NORVASC) 5 MG tablet Take 1  tablet (5 mg total) by mouth daily. Qty: 30 tablet, Refills: 0    aspirin EC 81 MG EC tablet Take 1 tablet (81 mg total) by mouth daily. Qty: 30 tablet, Refills: 0    carvedilol (COREG) 6.25 MG tablet Take 6.25 mg by mouth 2 (two) times daily. Refills: 11    hydrochlorothiazide (HYDRODIURIL) 12.5 MG tablet Take 12.5 mg by mouth daily. Refills: 11    levothyroxine (SYNTHROID, LEVOTHROID) 88 MCG tablet Take 88 mcg by mouth daily. Refills: 3    lisinopril (PRINIVIL,ZESTRIL) 20 MG tablet Take 1 tablet (20 mg total) by mouth daily. Qty: 30 tablet, Refills: 0    Multiple Vitamin (MULTIVITAMIN) tablet Take 1 tablet by mouth daily.    potassium chloride SA (K-DUR,KLOR-CON) 20 MEQ tablet Take 20 mEq by mouth daily.    rosuvastatin (CRESTOR) 5 MG tablet Take 5 mg by mouth daily. Refills: 3    Vitamin D, Ergocalciferol, (DRISDOL) 50000 units CAPS capsule Take 50,000 Units by mouth once a week. Refills: 3          Today   CHIEF COMPLAINT:  Doing well no SOB   VITAL SIGNS:  Blood pressure 123/80, pulse 75, temperature 97.4 F (36.3 C), temperature source Oral, resp. rate 20, height  (1.676 m), weight 65 kg (143 lb 4.8 oz), SpO2 94 %.   REVIEW OF SYSTEMS:  Review of Systems  Constitutional: Negative.  Negative for chills, fever and malaise/fatigue.  HENT: Negative.  Negative for ear discharge, ear pain, hearing loss, nosebleeds and sore throat.   Eyes: Negative.  Negative for blurred vision and pain.  Respiratory: Negative.  Negative for cough, hemoptysis, shortness of breath and wheezing.   Cardiovascular: Negative.  Negative for chest pain, palpitations and leg swelling.  Gastrointestinal: Negative.  Negative for abdominal pain, blood in stool, diarrhea, nausea and vomiting.  Genitourinary: Negative.  Negative for dysuria.  Musculoskeletal: Negative.  Negative for back pain.  Skin: Negative.   Neurological: Negative for dizziness, tremors, speech change, focal  weakness, seizures and headaches.  Endo/Heme/Allergies: Negative.  Does not bruise/bleed easily.  Psychiatric/Behavioral: Negative.  Negative for depression, hallucinations and suicidal ideas.     PHYSICAL EXAMINATION:  GENERAL:  81 y.o.-year-old patient lying in the bed with no acute distress.  NECK:  Supple, no jugular venous distention. No thyroid enlargement, no tenderness.  LUNGS: Normal breath sounds bilaterally, no wheezing, rales,rhonchi  No use of accessory muscles of respiration.  CARDIOVASCULAR: S1, S2 normal. No murmurs, rubs, or gallops.  ABDOMEN: Soft, non-tender, non-distended. Bowel sounds present. No organomegaly or mass.  EXTREMITIES: No pedal edema, cyanosis, or clubbing.  PSYCHIATRIC: The patient is alert and oriented x 3.  SKIN: No obvious rash, lesion, or ulcer.   DATA REVIEW:   CBC  Recent Labs Lab 01/02/17 0718  WBC 8.4  HGB 12.6  HCT 37.6  PLT 235    Chemistries   Recent Labs Lab 01/02/17 0718  01/04/17 0557  NA 139  < > 138  K 4.9  < > 3.7  CL 104  < > 103  CO2 30  < > 31  GLUCOSE 196*  < > 85  BUN 26*  < > 33*  CREATININE 0.97  < > 1.00  CALCIUM 9.1  < > 8.8*  MG  --   --  1.8  AST 59*  --   --   ALT 39  --   --   ALKPHOS 56  --   --   BILITOT 1.3*  --   --   < > = values in this interval not displayed.  Cardiac Enzymes  Recent Labs Lab 01/02/17 0718  TROPONINI <0.03    Microbiology Results  @  RADIOLOGY:  No results found.    Current Discharge Medication List    CONTINUE these medications which have CHANGED   Details  furosemide (LASIX) 20 MG tablet Take 1 tablet (20 mg total) by mouth 2 (two) times daily. Qty: 60 tablet, Refills: 0      CONTINUE these medications which have NOT CHANGED   Details  amLODipine (NORVASC) 5 MG tablet Take 1 tablet (5 mg total) by mouth daily. Qty: 30 tablet, Refills: 0    aspirin EC 81 MG EC tablet Take 1 tablet (81 mg total) by mouth daily. Qty: 30 tablet, Refills: 0     carvedilol (COREG) 6.25 MG tablet Take 6.25 mg by mouth 2 (two) times daily. Refills: 11    hydrochlorothiazide (HYDRODIURIL) 12.5 MG tablet Take 12.5 mg by mouth daily. Refills: 11    levothyroxine (SYNTHROID, LEVOTHROID) 88 MCG tablet Take 88 mcg by mouth daily. Refills: 3    lisinopril (PRINIVIL,ZESTRIL) 20 MG tablet Take 1 tablet (20 mg total) by mouth daily. Qty: 30 tablet, Refills: 0    Multiple Vitamin (MULTIVITAMIN) tablet Take 1 tablet by mouth daily.    potassium chloride SA (K-DUR,KLOR-CON) 20 MEQ tablet Take 20 mEq by mouth daily.    rosuvastatin (CRESTOR) 5 MG tablet Take 5 mg by mouth daily. Refills: 3    Vitamin D, Ergocalciferol, (DRISDOL) 50000  units CAPS capsule Take 50,000 Units by mouth once a week. Refills: 3          Management plans discussed with the patient and she is in agreement. Stable for discharge home with [facility]  Patient should follow up with pcp and cardiology  CODE STATUS:     Code Status Orders        Start     Ordered   01/02/17 0935  Full code  Continuous     01/02/17 0935    Code Status History    Date Active Date Inactive Code Status Order ID Comments User Context   12/12/2016  3:40 PM 12/14/2016  5:26 PM Full Code [id number]  Marguarite Arbour, MD Inpatient      TOTAL TIME TAKING CARE OF THIS PATIENT: 37 minutes.    Note: This dictation was prepared with Dragon dictation along with smaller phrase technology. Any transcriptional errors that result from this process are unintentional.  Jett Fukuda M.D on 01/04/2017 at 11:04 AM  Between 7am to 6pm - Pager - [phone] After 6pm go to [email or web address] - Social research officer, government  [facility] [place] [facility]  Office  [phone]  CC: Primary care physician; [facility]

## 2017-01-04 NOTE — Progress Notes (Addendum)
[facility] - [place] at [facility]   PATIENT NAME: Laura Barrera    MR#:  [id number]  DATE OF BIRTH:  Mar 29, 1936  SUBJECTIVE:  Doing well requiring O2 during day now  REVIEW OF SYSTEMS:    Review of Systems  Constitutional: Negative.  Negative for chills, fever and malaise/fatigue.  HENT: Negative.  Negative for ear discharge, ear pain, hearing loss, nosebleeds and sore throat.   Eyes: Negative.  Negative for blurred vision and pain.  Respiratory: Negative.  Negative for cough, hemoptysis, shortness of breath and wheezing.   Cardiovascular: Negative.  Negative for chest pain, palpitations and leg swelling.  Gastrointestinal: Negative.  Negative for abdominal pain, blood in stool, diarrhea, nausea and vomiting.  Genitourinary: Negative.  Negative for dysuria.  Musculoskeletal: Negative.  Negative for back pain.  Skin: Negative.   Neurological: Negative for dizziness, tremors, speech change, focal weakness, seizures and headaches.  Endo/Heme/Allergies: Negative.  Does not bruise/bleed easily.  Psychiatric/Behavioral: Negative.  Negative for depression, hallucinations and suicidal ideas.    Tolerating Diet: yes      DRUG ALLERGIES:   Allergies  Allergen Reactions  . Irbesartan Hives    VITALS:  Blood pressure 123/80, pulse 75, temperature 97.4 F (36.3 C), temperature source Oral, resp. rate 20, height  (1.676 m), weight 65 kg (143 lb 4.8 oz), SpO2 94 %.  PHYSICAL EXAMINATION:   Physical Exam  Constitutional: She is oriented to person, place, and time and well-developed, well-nourished, and in no distress. No distress.  HENT:  Head: Normocephalic.  Eyes: No scleral icterus.  Neck: Normal range of motion. Neck supple. No JVD present. No tracheal deviation present.  Cardiovascular: Normal rate, regular rhythm and normal heart sounds.  Exam reveals no gallop and no friction rub.   No murmur heard. Pulmonary/Chest: Effort normal and breath sounds  normal. No respiratory distress. She has no wheezes. She has no rales. She exhibits no tenderness.  Abdominal: Soft. Bowel sounds are normal. She exhibits no distension and no mass. There is no tenderness. There is no rebound and no guarding.  Musculoskeletal: Normal range of motion. She exhibits no edema.  Neurological: She is alert and oriented to person, place, and time.  Skin: Skin is warm. No rash noted. No erythema.  Psychiatric: Affect and judgment normal.      LABORATORY PANEL:   CBC  Recent Labs Lab 01/02/17 0718  WBC 8.4  HGB 12.6  HCT 37.6  PLT 235   ------------------------------------------------------------------------------------------------------------------  Chemistries   Recent Labs Lab 01/02/17 0718  01/04/17 0557  NA 139  < > 138  K 4.9  < > 3.7  CL 104  < > 103  CO2 30  < > 31  GLUCOSE 196*  < > 85  BUN 26*  < > 33*  CREATININE 0.97  < > 1.00  CALCIUM 9.1  < > 8.8*  MG  --   --  1.8  AST 59*  --   --   ALT 39  --   --   ALKPHOS 56  --   --   BILITOT 1.3*  --   --   < > = values in this interval not displayed. ------------------------------------------------------------------------------------------------------------------  Cardiac Enzymes  Recent Labs Lab 01/02/17 0718  TROPONINI <0.03   ------------------------------------------------------------------------------------------------------------------  RADIOLOGY:  No results found.   ASSESSMENT AND PLAN:   81 year old female with chronic systolic heart failure ejection fraction 35-40% who presented with shortness of breath and acute hypoxic respiratory failure.  1. Acute on chronic systolic heart failure with EF of 35-40% by echo in March with acute hypoxic respiratory failure Patient may require 24 hours of oxygen. She currently wears 2 L of oxygen at night.  Patient will continue Coreg, lisinopril and Lasix. She has referral for CHF clinic check CT chest to eval for PE given  new day time hypoxia  2. Essential hypertension: Patient will continue HCTZ, Coreg and lisinopril and Norvasc  3. Hypothyroidism: Continue Synthroid 4. Hyperlipidemia: Continue statin      Management plans discussed with the patient and she is in agreement.  CODE STATUS: full  TOTAL TIME TAKING CARE OF THIS PATIENT: 27 minutes.     POSSIBLE D/C today, DEPENDING ON CLINICAL CONDITION.   Jaylah Goodlow M.D on 01/04/2017 at 11:07 AM  Between 7am to 6pm - Pager - [phone] After 6pm go to [email or web address] - Social research officer, government  [facility]  Office  [phone]  CC: Primary care physician; [facility]  Note: This dictation was prepared with Dragon dictation along with smaller phrase technology. Any transcriptional errors that result from this process are unintentional.

## 2017-01-04 NOTE — Care Management (Deleted)
ENTERED NOTE ON WRONG EPISODE

## 2017-01-05 LAB — BASIC METABOLIC PANEL
Anion gap: 6 (ref 5–15)
BUN: 27 mg/dL — ABNORMAL HIGH (ref 6–20)
CALCIUM: 8.6 mg/dL — AB (ref 8.9–10.3)
CHLORIDE: 102 mmol/L (ref 101–111)
CO2: 31 mmol/L (ref 22–32)
Creatinine, Ser: 0.94 mg/dL (ref 0.44–1.00)
GFR calc Af Amer: >60 mL/min (ref >60)
GFR calc non Af Amer: 57 mL/min — ABNORMAL LOW (ref >60)
Glucose, Bld: 82 mg/dL (ref 65–99)
Potassium: 3.8 mmol/L (ref 3.5–5.1)
SODIUM: 139 mmol/L (ref 135–145)

## 2017-01-05 LAB — GLUCOSE, CAPILLARY
GLUCOSE-CAPILLARY: 80 mg/dL (ref 65–99)
Glucose-Capillary: 91 mg/dL (ref 65–99)

## 2017-01-05 NOTE — Progress Notes (Signed)
Pt had a 5 beat run of Vtach, pt is asleep and asymptomatic. MD notified, Dr. Sheryle Hail aware, and gave no new orders. Will continue to monitor. Shirley Friar, RN, BSN

## 2017-01-05 NOTE — Progress Notes (Signed)
Discharge instructions given to patient. Patient verbalized understanding. No distress at this time. IV and tele removed. Family member at bedside and will be transporting patient home.

## 2017-01-07 LAB — CULTURE, BLOOD (ROUTINE X 2)
Culture: NO GROWTH
Culture: NO GROWTH
SPECIAL REQUESTS: ADEQUATE
Special Requests: ADEQUATE

## 2017-01-11 ENCOUNTER — Ambulatory Visit: Payer: Medicare Other | Admitting: Family

## 2017-01-14 ENCOUNTER — Ambulatory Visit: Payer: Medicare Other | Attending: Family | Admitting: Family

## 2017-01-14 ENCOUNTER — Encounter: Payer: Self-pay | Admitting: Family

## 2017-01-14 VITALS — BP 101/68 | HR 74 | Resp 20 | Ht 65.0 in | Wt 142.5 lb

## 2017-01-14 DIAGNOSIS — Z9889 Other specified postprocedural states: Secondary | ICD-10-CM | POA: Insufficient documentation

## 2017-01-14 DIAGNOSIS — Z7982 Long term (current) use of aspirin: Secondary | ICD-10-CM | POA: Diagnosis not present

## 2017-01-14 DIAGNOSIS — I110 Hypertensive heart disease with heart failure: Secondary | ICD-10-CM | POA: Diagnosis not present

## 2017-01-14 DIAGNOSIS — Z79899 Other long term (current) drug therapy: Secondary | ICD-10-CM | POA: Diagnosis not present

## 2017-01-14 DIAGNOSIS — I10 Essential (primary) hypertension: Secondary | ICD-10-CM

## 2017-01-14 DIAGNOSIS — Z87891 Personal history of nicotine dependence: Secondary | ICD-10-CM | POA: Diagnosis not present

## 2017-01-14 DIAGNOSIS — I5022 Chronic systolic (congestive) heart failure: Secondary | ICD-10-CM | POA: Insufficient documentation

## 2017-01-14 DIAGNOSIS — Z90710 Acquired absence of both cervix and uterus: Secondary | ICD-10-CM | POA: Diagnosis not present

## 2017-01-14 NOTE — Progress Notes (Signed)
Patient ID: Laura Barrera, female    DOB: 05/06/1936, 81 y.o.   MRN: [id number]  HPI  Laura Barrera is a 81 y/o female with a history of thyroid disease, diverticulitis, HTN and chronic heart failure.   Reviewed last echo report from 12/12/16 which showed an EF of 35-40% along with severe MR. Mild/moderate pulmonary HTN also present.   Admitted 01/02/17 with HF exacerbation. Initially treated with IV diuretics and transitioned to oral diuretics. PE was ruled out via CT scan. Discharged home after 3 days with around the clock oxygen. Admitted 12/12/16 with HF exacerbation. Initially needed IV diuretics and was diuresed about 5100 cc. Weaned off of oxygen. Discharged home after 2 days.   She presents today for her initial visit with a chief complaint of mild shortness of breath with moderate exertion. She describes it as chronic in nature with improvement while wearing her oxygen. She has associated fatigue and minimal pedal edema.  Past Medical History:  Diagnosis Date  . Congestive heart failure (CHF) (HCC)   . Diverticulosis   . Hypertension   . Thyroid disease    Past Surgical History:  Procedure Laterality Date  . ABDOMINAL HYSTERECTOMY    . COLONOSCOPY    . THYROIDECTOMY     Family History  Problem Relation Age of Onset  . Hypertension Other    Social History  Substance Use Topics  . Smoking status: Former Games developer  . Smokeless tobacco: Never Used  . Alcohol use Not on file   Allergies  Allergen Reactions  . Irbesartan Hives   Prior to Admission medications   Medication Sig Start Date End Date Taking? Authorizing Provider  amLODipine (NORVASC) 5 MG tablet Take 1 tablet (5 mg total) by mouth daily. 12/15/16  Yes Altamese Dilling, MD  aspirin EC 81 MG EC tablet Take 1 tablet (81 mg total) by mouth daily. 12/15/16  Yes Altamese Dilling, MD  carvedilol (COREG) 6.25 MG tablet Take 6.25 mg by mouth 2 (two) times daily. 11/16/16  Yes Historical Provider, MD  furosemide (LASIX) 20  MG tablet Take 1 tablet (20 mg total) by mouth 2 (two) times daily. 01/04/17  Yes Sital Mody, MD  hydrochlorothiazide (HYDRODIURIL) 12.5 MG tablet Take 12.5 mg by mouth daily. 11/04/16  Yes Historical Provider, MD  levothyroxine (SYNTHROID, LEVOTHROID) 88 MCG tablet Take 88 mcg by mouth daily. 11/28/16  Yes Historical Provider, MD  lisinopril (PRINIVIL,ZESTRIL) 20 MG tablet Take 1 tablet (20 mg total) by mouth daily. 12/15/16  Yes Altamese Dilling, MD  Multiple Vitamin (MULTIVITAMIN) tablet Take 1 tablet by mouth daily.   Yes Historical Provider, MD  potassium chloride SA (K-DUR,KLOR-CON) 20 MEQ tablet Take 20 mEq by mouth daily. 12/09/16  Yes Historical Provider, MD  rosuvastatin (CRESTOR) 5 MG tablet Take 5 mg by mouth daily. 11/12/16  Yes Historical Provider, MD  Vitamin D, Ergocalciferol, (DRISDOL) 50000 units CAPS capsule Take 50,000 Units by mouth once a week. 12/03/16  Yes Historical Provider, MD    Review of Systems  Constitutional: Positive for fatigue. Negative for appetite change.  HENT: Positive for rhinorrhea. Negative for congestion and sore throat.   Eyes: Negative.   Respiratory: Positive for shortness of breath (minimal). Negative for chest tightness.   Cardiovascular: Positive for leg swelling. Negative for chest pain and palpitations.  Gastrointestinal: Negative for abdominal distention and abdominal pain.  Endocrine: Negative.   Genitourinary: Negative.   Musculoskeletal: Negative for back pain and neck pain.  Skin: Negative.   Allergic/Immunologic: Negative.  Neurological: Negative for dizziness and light-headedness.  Hematological: Negative for adenopathy. Does not bruise/bleed easily.  Psychiatric/Behavioral: Negative for dysphoric mood, sleep disturbance (wearing oxygen at 2L around the clock) and suicidal ideas. The patient is not nervous/anxious.    . Vitals:   01/14/17 1302  BP: 101/68  Pulse: 74  Resp: 20  SpO2: 98%  Weight: 142 lb 8 oz (64.6 kg)  Height: 5'  5" (1.651 m)   Wt Readings from Last 3 Encounters:  01/14/17 142 lb 8 oz (64.6 kg)  01/05/17 143 lb 6.4 oz (65 kg)  12/14/16 151 lb 1.6 oz (68.5 kg)   Lab Results  Component Value Date   CREATININE 0.94 01/05/2017   CREATININE 1.00 01/04/2017   CREATININE 1.06 (H) 01/03/2017   Physical Exam  Constitutional: She is oriented to person, place, and time. She appears well-developed and well-nourished.  HENT:  Head: Normocephalic and atraumatic.  Neck: Normal range of motion. Neck supple. No JVD present.  Cardiovascular: Normal rate and regular rhythm.   Pulmonary/Chest: Effort normal. She has no wheezes. She has no rales.  Abdominal: Soft. She exhibits no distension. There is no tenderness.  Musculoskeletal: She exhibits edema (trace edema in bilateral lower legs). She exhibits no tenderness.  Neurological: She is alert and oriented to person, place, and time.  Skin: Skin is warm and dry.  Psychiatric: She has a normal mood and affect. Her behavior is normal. Thought content normal.  Nursing note and vitals reviewed.   Assessment & Plan:  1: Chronic heart failure with reduced ejection fraction- - NYHA class II - euvolemic today - already weighing daily. Advised to call for an overnight weight gain of >2 pounds or a weekly weight gain of >5 pounds - not adding salt to food and daughter is reading food labels. Husband is now cooking meals from fresh foods instead of using processed foods. Written dietary information was given to them about a  sodium diet. - she is unsure of how much fluid she is drinking. Discussed measuring it so that she maintains her fluid intake between 40-50 ounces daily - no edema upon awakening but does have some edema as the day progresses. Encouraged her to elevate her legs during the day  - could change her lisinopril to entresto if her blood pressure allows - saw cardiologist Lady Gary) yesterday and returns to him in 3 months  2: HTN- - BP on the low  side today although denies dizziness - saw PCP Allena Katz) 11/11/16  Medication list was reviewed.  Return here in 1 month or sooner for any questions/problems before then.

## 2017-01-14 NOTE — Patient Instructions (Signed)
Continue weighing daily and call for an overnight weight gain of > 2 pounds or a weekly weight gain of >5 pounds. 

## 2017-02-02 ENCOUNTER — Telehealth: Payer: Self-pay

## 2017-02-02 NOTE — Telephone Encounter (Signed)
Pts daughter called and states that her mom had a 2 lb overnight weight gain. Her weight was 135 lbs yesterday and today she weights 137 lbs. She is not experiencing any symptoms.  Per Inetta Fermoina pt is to weigh again tomorrow and call the office with weight.

## 2017-02-03 ENCOUNTER — Telehealth: Payer: Self-pay

## 2017-02-03 NOTE — Telephone Encounter (Signed)
Patients daughter Laura Barrera called the office to report that Laura Barrera's weight today is back down to 135 lb. She states that her mother is feeling well, and not having any symptoms.

## 2017-02-15 ENCOUNTER — Encounter: Payer: Self-pay | Admitting: Family

## 2017-02-15 ENCOUNTER — Ambulatory Visit: Payer: Medicare Other | Attending: Family | Admitting: Family

## 2017-02-15 VITALS — BP 99/64 | HR 74 | Resp 20 | Ht 65.0 in | Wt 141.0 lb

## 2017-02-15 DIAGNOSIS — R0602 Shortness of breath: Secondary | ICD-10-CM | POA: Insufficient documentation

## 2017-02-15 DIAGNOSIS — Z9889 Other specified postprocedural states: Secondary | ICD-10-CM | POA: Diagnosis not present

## 2017-02-15 DIAGNOSIS — Z7982 Long term (current) use of aspirin: Secondary | ICD-10-CM | POA: Diagnosis not present

## 2017-02-15 DIAGNOSIS — I509 Heart failure, unspecified: Secondary | ICD-10-CM | POA: Insufficient documentation

## 2017-02-15 DIAGNOSIS — E079 Disorder of thyroid, unspecified: Secondary | ICD-10-CM | POA: Insufficient documentation

## 2017-02-15 DIAGNOSIS — Z90710 Acquired absence of both cervix and uterus: Secondary | ICD-10-CM | POA: Diagnosis not present

## 2017-02-15 DIAGNOSIS — I5022 Chronic systolic (congestive) heart failure: Secondary | ICD-10-CM

## 2017-02-15 DIAGNOSIS — I110 Hypertensive heart disease with heart failure: Secondary | ICD-10-CM | POA: Insufficient documentation

## 2017-02-15 DIAGNOSIS — Z79899 Other long term (current) drug therapy: Secondary | ICD-10-CM | POA: Insufficient documentation

## 2017-02-15 DIAGNOSIS — I10 Essential (primary) hypertension: Secondary | ICD-10-CM

## 2017-02-15 DIAGNOSIS — Z87891 Personal history of nicotine dependence: Secondary | ICD-10-CM | POA: Insufficient documentation

## 2017-02-15 LAB — BASIC METABOLIC PANEL
Anion gap: 7 (ref 5–15)
BUN: 31 mg/dL — ABNORMAL HIGH (ref 6–20)
CHLORIDE: 99 mmol/L — AB (ref 101–111)
CO2: 32 mmol/L (ref 22–32)
Calcium: 9.5 mg/dL (ref 8.9–10.3)
Creatinine, Ser: 1.08 mg/dL — ABNORMAL HIGH (ref 0.44–1.00)
GFR calc Af Amer: 55 mL/min — ABNORMAL LOW (ref >60)
GFR calc non Af Amer: 48 mL/min — ABNORMAL LOW (ref >60)
GLUCOSE: 87 mg/dL (ref 65–99)
POTASSIUM: 4.5 mmol/L (ref 3.5–5.1)
Sodium: 138 mmol/L (ref 135–145)

## 2017-02-15 NOTE — Patient Instructions (Addendum)
Continue weighing daily and call for an overnight weight gain of > 2 pounds or a weekly weight gain of >5 pounds.  Stop amlodipine 

## 2017-02-15 NOTE — Progress Notes (Signed)
Patient ID: Laura Barrera, female    DOB: 12/18/1935, 81 y.o.   MRN: [id number]  HPI  [name] is a 81 y/o female with a history of thyroid disease, diverticulitis, HTN and chronic heart failure.   Reviewed last echo report from 12/12/16 which showed an EF of 35-40% along with severe MR. Mild/moderate pulmonary HTN also present.   Was in the ED 02/11/17 due to palpitations. Evaluated and discharged home. Admitted 01/02/17 with HF exacerbation. Initially treated with IV diuretics and transitioned to oral diuretics. PE was ruled out via CT scan. Discharged home after 3 days with around the clock oxygen. Admitted 12/12/16 with HF exacerbation. Initially needed IV diuretics and was diuresed about 5100 cc. Weaned off of oxygen. Discharged home after 2 days.   She presents today for her follow-up visit with a chief complaint of mild shortness of breath with moderate exertion. She describes it as chronic in nature with improvement while wearing her oxygen. She has associated fatigue along with this.   Past Medical History:  Diagnosis Date  . Congestive heart failure (CHF) (HCC)   . Diverticulosis   . Hypertension   . Thyroid disease    Past Surgical History:  Procedure Laterality Date  . ABDOMINAL HYSTERECTOMY    . COLONOSCOPY    . THYROIDECTOMY     Family History  Problem Relation Age of Onset  . Hypertension Other    Social History  Substance Use Topics  . Smoking status: Former Games developermoker  . Smokeless tobacco: Never Used  . Alcohol use Not on file   Allergies  Allergen Reactions  . Irbesartan Hives   Prior to Admission medications   Medication Sig Start Date End Date Taking? Authorizing Provider  aspirin EC 81 MG EC tablet Take 1 tablet (81 mg total) by mouth daily. 12/15/16  Yes Altamese DillingVachhani, Vaibhavkumar, MD  carvedilol (COREG) 6.25 MG tablet Take 6.25 mg by mouth 2 (two) times daily. 11/16/16  Yes [provider]  furosemide (LASIX) 20 MG tablet Take 1 tablet (20 mg total) by mouth  2 (two) times daily. Patient taking differently: Take 40 mg by mouth 2 (two) times daily.  01/04/17  Yes Mody, Patricia PesaSital, MD  levothyroxine (SYNTHROID, LEVOTHROID) 88 MCG tablet Take 88 mcg by mouth daily. 11/28/16  Yes [provider]  lisinopril (PRINIVIL,ZESTRIL) 20 MG tablet Take 1 tablet (20 mg total) by mouth daily. 12/15/16  Yes Altamese DillingVachhani, Vaibhavkumar, MD  Multiple Vitamin (MULTIVITAMIN) tablet Take 1 tablet by mouth daily.   Yes [provider]  potassium chloride SA (K-DUR,KLOR-CON) 20 MEQ tablet Take 20 mEq by mouth daily. 12/09/16  Yes [provider]  rosuvastatin (CRESTOR) 5 MG tablet Take 5 mg by mouth daily. 11/12/16  Yes [provider]  Vitamin D, Ergocalciferol, (DRISDOL) 50000 units CAPS capsule Take 50,000 Units by mouth once a week. 12/03/16  Yes [provider]    Review of Systems  Constitutional: Positive for fatigue. Negative for appetite change.  HENT: Positive for rhinorrhea. Negative for congestion and sore throat.   Eyes: Negative.   Respiratory: Positive for shortness of breath. Negative for chest tightness.   Cardiovascular: Negative for chest pain, palpitations and leg swelling.  Gastrointestinal: Negative for abdominal distention and abdominal pain.  Endocrine: Negative.   Genitourinary: Negative.   Musculoskeletal: Negative for back pain and neck pain.  Skin: Negative.   Allergic/Immunologic: Negative.   Neurological: Negative for dizziness and light-headedness.  Hematological: Negative for adenopathy. Does not bruise/bleed easily.  Psychiatric/Behavioral: Negative for dysphoric mood, sleep disturbance (wearing oxygen at 2L around the clock) and suicidal ideas. The patient is not nervous/anxious.    . Vitals:   02/15/17 1308  BP: 99/64  Pulse: 74  Resp: 20  SpO2: 100%  Weight: 141 lb (64 kg)  Height: 5\' 5"  (1.651 m)   Wt Readings from Last 3 Encounters:  02/15/17 141 lb (64 kg)  01/14/17 142 lb 8 oz (64.6 kg)   01/05/17 143 lb 6.4 oz (65 kg)   Lab Results  Component Value Date   CREATININE 0.94 01/05/2017   CREATININE 1.00 01/04/2017   CREATININE 1.06 (H) 01/03/2017   Physical Exam  Constitutional: She is oriented to person, place, and time. She appears well-developed and well-nourished.  HENT:  Head: Normocephalic and atraumatic.  Neck: Normal range of motion. Neck supple. No JVD present.  Cardiovascular: Normal rate and regular rhythm.   Pulmonary/Chest: Effort normal. She has no wheezes. She has no rales.  Abdominal: Soft. She exhibits no distension. There is no tenderness.  Musculoskeletal: She exhibits no edema or tenderness.  Neurological: She is alert and oriented to person, place, and time.  Skin: Skin is warm and dry.  Psychiatric: She has a normal mood and affect. Her behavior is normal. Thought content normal.  Nursing note and vitals reviewed.   Assessment & Plan:  1: Chronic heart failure with reduced ejection fraction- - NYHA class II - euvolemic today - already weighing daily. Advised to call for an overnight weight gain of >2 pounds or a weekly weight gain of >5 pounds - not adding salt to food and daughter is reading food labels.  - she is unsure of how much fluid she is drinking. Discussed measuring it so that she maintains her fluid intake between 40-50 ounces daily - doubt that we can change her lisinopril to entresto due to her blood pressure - saw cardiologist Lady Gary) 01/13/17 and returns to him 02/18/17 - waiting to hear about getting a sleep study rescheduled - BMP drawn today since she's now taking 40mg  furosemide BID  2: HTN- - BP remains on the low side today although denies dizziness - d/c amlodipine - saw PCP Allena Katz) 12/16/16  Medication list was reviewed.  Return here in 2 months or sooner for any questions/problems before then.

## 2017-04-19 ENCOUNTER — Ambulatory Visit: Payer: Medicare Other | Admitting: Family

## 2017-04-27 ENCOUNTER — Ambulatory Visit: Payer: Medicare Other | Attending: Family | Admitting: Family

## 2017-04-27 ENCOUNTER — Encounter: Payer: Self-pay | Admitting: Family

## 2017-04-27 VITALS — BP 109/71 | HR 77 | Resp 20 | Ht 65.0 in | Wt 135.5 lb

## 2017-04-27 DIAGNOSIS — I272 Pulmonary hypertension, unspecified: Secondary | ICD-10-CM | POA: Diagnosis not present

## 2017-04-27 DIAGNOSIS — R002 Palpitations: Secondary | ICD-10-CM | POA: Diagnosis not present

## 2017-04-27 DIAGNOSIS — I5022 Chronic systolic (congestive) heart failure: Secondary | ICD-10-CM

## 2017-04-27 DIAGNOSIS — E079 Disorder of thyroid, unspecified: Secondary | ICD-10-CM | POA: Diagnosis not present

## 2017-04-27 DIAGNOSIS — I10 Essential (primary) hypertension: Secondary | ICD-10-CM

## 2017-04-27 DIAGNOSIS — Z9889 Other specified postprocedural states: Secondary | ICD-10-CM | POA: Insufficient documentation

## 2017-04-27 DIAGNOSIS — I110 Hypertensive heart disease with heart failure: Secondary | ICD-10-CM | POA: Insufficient documentation

## 2017-04-27 DIAGNOSIS — Z87891 Personal history of nicotine dependence: Secondary | ICD-10-CM | POA: Diagnosis not present

## 2017-04-27 DIAGNOSIS — Z90710 Acquired absence of both cervix and uterus: Secondary | ICD-10-CM | POA: Insufficient documentation

## 2017-04-27 DIAGNOSIS — Z888 Allergy status to other drugs, medicaments and biological substances status: Secondary | ICD-10-CM | POA: Insufficient documentation

## 2017-04-27 DIAGNOSIS — Z7982 Long term (current) use of aspirin: Secondary | ICD-10-CM | POA: Diagnosis not present

## 2017-04-27 DIAGNOSIS — Z79899 Other long term (current) drug therapy: Secondary | ICD-10-CM | POA: Diagnosis not present

## 2017-04-27 NOTE — Patient Instructions (Signed)
Continue weighing daily and call for an overnight weight gain of > 2 pounds or a weekly weight gain of >5 pounds. 

## 2017-04-27 NOTE — Progress Notes (Signed)
Patient ID: Ronnald CollumBetty F Riedlinger, female    DOB: 01/01/1936, 81 y.o.   MRN: [id number]  HPI  Ms Arlana Pouchate is a 81 y/o female with a history of thyroid disease, diverticulitis, HTN and chronic heart failure.   Reviewed last echo report from 12/12/16 which showed an EF of 35-40% along with severe MR. Mild/moderate pulmonary HTN also present.   Was in the ED 02/11/17 due to palpitations. Evaluated and discharged home. Admitted 01/02/17 with HF exacerbation. Initially treated with IV diuretics and transitioned to oral diuretics. PE was ruled out via CT scan. Discharged home after 3 days with around the clock oxygen. Admitted 12/12/16 with HF exacerbation. Initially needed IV diuretics and was diuresed about 5100 cc. Weaned off of oxygen. Discharged home after 2 days.   She presents today for her follow-up visit with a chief complaint of mild fatigue upon moderate exertion. She describes this as chronic in nature having been present for several years with varying levels of severity. She denies any associated symptoms such as shortness of breath, chest pain, edema, weight gain or difficulty sleeping. Continues to wear oxygen at 2L around the clock.   Past Medical History:  Diagnosis Date  . Congestive heart failure (CHF) (HCC)   . Diverticulosis   . Hypertension   . Thyroid disease    Past Surgical History:  Procedure Laterality Date  . ABDOMINAL HYSTERECTOMY    . COLONOSCOPY    . THYROIDECTOMY     Family History  Problem Relation Age of Onset  . Hypertension Other    Social History  Substance Use Topics  . Smoking status: Former Games developermoker  . Smokeless tobacco: Never Used  . Alcohol use Not on file   Allergies  Allergen Reactions  . Irbesartan Hives   Prior to Admission medications   Medication Sig Start Date End Date Taking? Authorizing Provider  aspirin EC 81 MG EC tablet Take 1 tablet (81 mg total) by mouth daily. 12/15/16  Yes Altamese DillingVachhani, Vaibhavkumar, MD  carvedilol (COREG) 6.25 MG tablet Take 6.25  mg by mouth 2 (two) times daily. 11/16/16  Yes [provider]  furosemide (LASIX) 20 MG tablet Take 1 tablet (20 mg total) by mouth 2 (two) times daily. 01/04/17  Yes Mody, Patricia PesaSital, MD  levothyroxine (SYNTHROID, LEVOTHROID) 88 MCG tablet Take 88 mcg by mouth daily. 11/28/16  Yes [provider]  lisinopril (PRINIVIL,ZESTRIL) 20 MG tablet Take 1 tablet (20 mg total) by mouth daily. 12/15/16  Yes Altamese DillingVachhani, Vaibhavkumar, MD  Multiple Vitamin (MULTIVITAMIN) tablet Take 1 tablet by mouth daily.   Yes [provider]  potassium chloride SA (K-DUR,KLOR-CON) 20 MEQ tablet Take 20 mEq by mouth daily. 12/09/16  Yes [provider]  rosuvastatin (CRESTOR) 5 MG tablet Take 5 mg by mouth daily. 11/12/16  Yes [provider]  Vitamin D, Ergocalciferol, (DRISDOL) 50000 units CAPS capsule Take 50,000 Units by mouth as directed. Every other week 12/03/16  Yes [provider]    Review of Systems  Constitutional: Positive for fatigue. Negative for appetite change.  HENT: Positive for rhinorrhea. Negative for congestion and sore throat.   Eyes: Negative.   Respiratory: Negative for cough, chest tightness and shortness of breath.   Cardiovascular: Negative for chest pain, palpitations and leg swelling.  Gastrointestinal: Negative for abdominal distention and abdominal pain.  Endocrine: Negative.   Genitourinary: Negative.   Musculoskeletal: Negative for back pain and neck pain.  Skin: Negative.   Allergic/Immunologic: Negative.   Neurological: Negative for dizziness  and light-headedness.  Hematological: Negative for adenopathy. Does not bruise/bleed easily.  Psychiatric/Behavioral: Negative for dysphoric mood, sleep disturbance (wearing oxygen at 2L around the clock) and suicidal ideas. The patient is not nervous/anxious.    Vitals:   04/27/17 1347  BP: 109/71  Pulse: 77  Resp: 20  SpO2: 99%  Weight: 135 lb 8 oz (61.5 kg)  Height: 5\' 5"  (1.651 m)   Wt  Readings from Last 3 Encounters:  04/27/17 135 lb 8 oz (61.5 kg)  02/15/17 141 lb (64 kg)  01/14/17 142 lb 8 oz (64.6 kg)    Lab Results  Component Value Date   CREATININE 1.08 (H) 02/15/2017   CREATININE 0.94 01/05/2017   CREATININE 1.00 01/04/2017   Physical Exam  Constitutional: She is oriented to person, place, and time. She appears well-developed and well-nourished.  HENT:  Head: Normocephalic and atraumatic.  Neck: Normal range of motion. Neck supple. No JVD present.  Cardiovascular: Normal rate and regular rhythm.   Pulmonary/Chest: Effort normal. She has no wheezes. She has no rales.  Abdominal: Soft. She exhibits no distension. There is no tenderness.  Musculoskeletal: She exhibits no edema or tenderness.  Neurological: She is alert and oriented to person, place, and time.  Skin: Skin is warm and dry.  Psychiatric: She has a normal mood and affect. Her behavior is normal. Thought content normal.  Nursing note and vitals reviewed.   Assessment & Plan:  1: Chronic heart failure with reduced ejection fraction- - NYHA class II - euvolemic today - already weighing daily. Advised to call for an overnight weight gain of >2 pounds or a weekly weight gain of >5 pounds. Has lost 6 pounds since she was last here - not adding salt to food and daughter is reading food labels.  - she is unsure of how much fluid she is drinking. Discussed measuring it so that she maintains her fluid intake between 40-50 ounces daily - doubt that we can change her lisinopril to entresto due to her blood pressure - saw cardiologist Lady Gary([place]) 02/18/17 and returns August 2018  2: HTN- - BP remains on the low side today although denies dizziness - BMP from 03/01/17 reviewed and shows potassium 4.1, sodium 142 and GFR 58 - saw PCP Allena Katz([place]) 04/08/17  Medication list was reviewed.  Return here in 4 months or sooner for any questions/problems before then.

## 2017-08-30 ENCOUNTER — Other Ambulatory Visit: Payer: Self-pay

## 2017-08-30 ENCOUNTER — Encounter: Payer: Self-pay | Admitting: Family

## 2017-08-30 ENCOUNTER — Ambulatory Visit: Payer: Medicare Other | Attending: Family | Admitting: Family

## 2017-08-30 VITALS — BP 123/74 | HR 72 | Resp 18 | Ht 65.0 in | Wt 140.5 lb

## 2017-08-30 DIAGNOSIS — I5022 Chronic systolic (congestive) heart failure: Secondary | ICD-10-CM | POA: Diagnosis present

## 2017-08-30 DIAGNOSIS — Z7982 Long term (current) use of aspirin: Secondary | ICD-10-CM | POA: Insufficient documentation

## 2017-08-30 DIAGNOSIS — Z87891 Personal history of nicotine dependence: Secondary | ICD-10-CM | POA: Diagnosis not present

## 2017-08-30 DIAGNOSIS — K5792 Diverticulitis of intestine, part unspecified, without perforation or abscess without bleeding: Secondary | ICD-10-CM | POA: Insufficient documentation

## 2017-08-30 DIAGNOSIS — I110 Hypertensive heart disease with heart failure: Secondary | ICD-10-CM | POA: Insufficient documentation

## 2017-08-30 DIAGNOSIS — I272 Pulmonary hypertension, unspecified: Secondary | ICD-10-CM | POA: Diagnosis not present

## 2017-08-30 DIAGNOSIS — E079 Disorder of thyroid, unspecified: Secondary | ICD-10-CM | POA: Diagnosis not present

## 2017-08-30 DIAGNOSIS — Z79899 Other long term (current) drug therapy: Secondary | ICD-10-CM | POA: Diagnosis not present

## 2017-08-30 DIAGNOSIS — I10 Essential (primary) hypertension: Secondary | ICD-10-CM

## 2017-08-30 NOTE — Progress Notes (Signed)
Patient ID: Ronnald CollumBetty F Ringer, female    DOB: 05/29/1936, 81 y.o.   MRN: [id number]  HPI  Ms Arlana Pouchate is a 81 y/o female with a history of thyroid disease, diverticulitis, HTN and chronic heart failure.   Reviewed last echo report from 12/12/16 which showed an EF of 35-40% along with severe MR. Mild/moderate pulmonary HTN also present.   Has not been admitted or been in the ED in the last 6 months.   She presents today for her follow-up visit with a chief complaint of mild fatigue upon moderate exertion. She describes this as having been present for several years with varying levels of severity. She has associated light-headedness and gradual weight gain along with this. She denies any chest pain, shortness of breath, edema, palpitations or difficulty sleeping.   Past Medical History:  Diagnosis Date  . Congestive heart failure (CHF) (HCC)   . Diverticulosis   . Hypertension   . Thyroid disease    Past Surgical History:  Procedure Laterality Date  . ABDOMINAL HYSTERECTOMY    . COLONOSCOPY    . THYROIDECTOMY     Family History  Problem Relation Age of Onset  . Hypertension Other    Social History   Tobacco Use  . Smoking status: Former Games developermoker  . Smokeless tobacco: Never Used  Substance Use Topics  . Alcohol use: Not on file   Allergies  Allergen Reactions  . Irbesartan Hives   Prior to Admission medications   Medication Sig Start Date End Date Taking? Authorizing Provider  aspirin EC 81 MG EC tablet Take 1 tablet (81 mg total) by mouth daily. 12/15/16  Yes Altamese DillingVachhani, Vaibhavkumar, MD  carvedilol (COREG) 6.25 MG tablet Take 6.25 mg by mouth 2 (two) times daily. 11/16/16  Yes [provider]  furosemide (LASIX) 20 MG tablet Take 1 tablet (20 mg total) by mouth 2 (two) times daily. 01/04/17  Yes Mody, Patricia PesaSital, MD  levothyroxine (SYNTHROID, LEVOTHROID) 88 MCG tablet Take 88 mcg by mouth daily. 11/28/16  Yes [provider]  lisinopril (PRINIVIL,ZESTRIL) 20 MG tablet Take 1  tablet (20 mg total) by mouth daily. 12/15/16  Yes Altamese DillingVachhani, Vaibhavkumar, MD  Multiple Vitamin (MULTIVITAMIN) tablet Take 1 tablet by mouth daily.   Yes [provider]  potassium chloride SA (K-DUR,KLOR-CON) 20 MEQ tablet Take 20 mEq by mouth daily. 12/09/16  Yes [provider]  rosuvastatin (CRESTOR) 5 MG tablet Take 5 mg by mouth daily. 11/12/16  Yes [provider]  Vitamin D, Ergocalciferol, (DRISDOL) 50000 units CAPS capsule Take 50,000 Units by mouth as directed. Every other week 12/03/16  Yes [provider]    Review of Systems  Constitutional: Positive for fatigue. Negative for appetite change.  HENT: Positive for rhinorrhea. Negative for congestion and sore throat.   Eyes: Negative.   Respiratory: Negative for cough, chest tightness and shortness of breath.   Cardiovascular: Negative for chest pain, palpitations and leg swelling.  Gastrointestinal: Negative for abdominal distention and abdominal pain.  Endocrine: Negative.   Genitourinary: Negative.   Musculoskeletal: Negative for back pain and neck pain.  Skin: Negative.   Allergic/Immunologic: Negative.   Neurological: Positive for light-headedness (intermittently). Negative for dizziness.  Hematological: Negative for adenopathy. Does not bruise/bleed easily.  Psychiatric/Behavioral: Negative for dysphoric mood, sleep disturbance (wearing oxygen at 2L around the clock) and suicidal ideas. The patient is not nervous/anxious.    Vitals:   08/30/17 1329  BP: 123/74  Pulse: 72  Resp: 18  SpO2: 100%  Weight: 140 lb 8 oz (63.7 kg)  Height: 5\' 5"  (1.651 m)   Wt Readings from Last 3 Encounters:  08/30/17 140 lb 8 oz (63.7 kg)  04/27/17 135 lb 8 oz (61.5 kg)  02/15/17 141 lb (64 kg)    Lab Results  Component Value Date   CREATININE 1.08 (H) 02/15/2017   CREATININE 0.94 01/05/2017   CREATININE 1.00 01/04/2017   Physical Exam  Constitutional: She is oriented to person, place, and time.  She appears well-developed and well-nourished.  HENT:  Head: Normocephalic and atraumatic.  Neck: Normal range of motion. Neck supple. No JVD present.  Cardiovascular: Normal rate and regular rhythm.  Pulmonary/Chest: Effort normal. She has no wheezes. She has no rales.  Abdominal: Soft. She exhibits no distension. There is no tenderness.  Musculoskeletal: She exhibits no edema or tenderness.  Neurological: She is alert and oriented to person, place, and time.  Skin: Skin is warm and dry.  Psychiatric: She has a normal mood and affect. Her behavior is normal. Thought content normal.  Nursing note and vitals reviewed.   Assessment & Plan:  1: Chronic heart failure with reduced ejection fraction- - NYHA class II - euvolemic today - already weighing daily. Advised to call for an overnight weight gain of >2 pounds or a weekly weight gain of >5 pounds.  - weight up 5 pounds from the last time she was here - not adding salt to food and daughter is reading food labels.  - she is unsure of how much fluid she is drinking. Discussed measuring it so that she maintains her fluid intake between 40-50 ounces daily - could change to entresto if her BP would allow - saw cardiologist Lady Gary([facility]) 05/21/17 and returns February 2019  2: HTN- - BP looks good today although historically has been low - BMP from 03/01/17 reviewed and shows potassium 4.1, sodium 142 and GFR 58 - saw PCP Allena Katz([facility]) 06/07/17  Medication list was reviewed.  Return here in 3 months or sooner for any questions/problems before then.

## 2017-08-30 NOTE — Patient Instructions (Signed)
Continue weighing daily and call for an overnight weight gain of > 2 pounds or a weekly weight gain of >5 pounds. 

## 2017-10-28 ENCOUNTER — Telehealth: Payer: Self-pay

## 2017-10-28 NOTE — Telephone Encounter (Signed)
Ms. Laura Barrera's daughter contacted the office to report that he mother has has an overnight weight gain of 2 lbs. She denies any shortness of breath, chest pain or edema. She does report that another family member had brought Ms. Laura Barrera some [employer] the pervious day and they were not sure about the sodiu, in that meal.  Advised daughter to monitor her mom the rest of today and to contact the office if there are any changes with edema, or shortness of breath. Would like her to weigh the patient in the morning and contact the office with an update on weight tomorrow.   Daughter verbalized understanding and will call back in the morning to update.

## 2017-12-20 ENCOUNTER — Ambulatory Visit: Payer: Medicare Other | Admitting: Family

## 2017-12-21 NOTE — Progress Notes (Signed)
Patient ID: Laura Barrera, female    DOB: 10/19/1935, 82 y.o.   MRN: [id number]  HPI  Laura Barrera is a 82 y/o female with a history of thyroid disease, diverticulitis, HTN and chronic heart failure.   Reviewed last echo report from 12/12/16 which showed an EF of 35-40% along with severe MR. Mild/moderate pulmonary HTN also present.   Has not been admitted or been in the ED in the last 6 months.   She presents today for her follow-up visit with a chief complaint of minimal fatigue upon moderate exertion. She says this has been chronic for many years. She has associated light-headedness along with this. She denies any difficulty sleeping, abdominal distention, palpitations, edema, chest pain, shortness of breath, cough or weight gain.   Past Medical History:  Diagnosis Date  . Congestive heart failure (CHF) (HCC)   . Diverticulosis   . Hypertension   . Thyroid disease    Past Surgical History:  Procedure Laterality Date  . ABDOMINAL HYSTERECTOMY    . COLONOSCOPY    . THYROIDECTOMY     Family History  Problem Relation Age of Onset  . Hypertension Other    Social History   Tobacco Use  . Smoking status: Former Games developermoker  . Smokeless tobacco: Never Used  Substance Use Topics  . Alcohol use: Not on file   Allergies  Allergen Reactions  . Irbesartan Hives   Prior to Admission medications   Medication Sig Start Date End Date Taking? Authorizing Provider  aspirin EC 81 MG EC tablet Take 1 tablet (81 mg total) by mouth daily. 12/15/16  Yes Altamese DillingVachhani, Vaibhavkumar, MD  carvedilol (COREG) 6.25 MG tablet Take 6.25 mg by mouth 2 (two) times daily. 11/16/16  Yes [provider]  furosemide (LASIX) 20 MG tablet Take 1 tablet (20 mg total) by mouth 2 (two) times daily. 01/04/17  Yes Mody, Patricia PesaSital, MD  levothyroxine (SYNTHROID, LEVOTHROID) 88 MCG tablet Take 88 mcg by mouth daily. 11/28/16  Yes [provider]  lisinopril (PRINIVIL,ZESTRIL) 20 MG tablet Take 1 tablet (20 mg total) by  mouth daily. 12/15/16  Yes Altamese DillingVachhani, Vaibhavkumar, MD  Multiple Vitamin (MULTIVITAMIN) tablet Take 1 tablet by mouth daily.   Yes [provider]  potassium chloride SA (K-DUR,KLOR-CON) 20 MEQ tablet Take 20 mEq by mouth daily. 12/09/16  Yes [provider]  rosuvastatin (CRESTOR) 5 MG tablet Take 5 mg by mouth daily. 11/12/16  Yes [provider]  Vitamin D, Ergocalciferol, (DRISDOL) 50000 units CAPS capsule Take 50,000 Units by mouth as directed. Every other week 12/03/16  Yes [provider]   Review of Systems  Constitutional: Positive for fatigue. Negative for appetite change.  HENT: Negative for congestion, rhinorrhea and sore throat.   Eyes: Negative.   Respiratory: Negative for cough, chest tightness and shortness of breath.   Cardiovascular: Negative for chest pain, palpitations and leg swelling.  Gastrointestinal: Negative for abdominal distention and abdominal pain.  Endocrine: Negative.   Genitourinary: Negative.   Musculoskeletal: Negative for back pain and neck pain.  Skin: Negative.   Allergic/Immunologic: Negative.   Neurological: Positive for light-headedness (intermittently). Negative for dizziness.  Hematological: Negative for adenopathy. Does not bruise/bleed easily.  Psychiatric/Behavioral: Negative for dysphoric mood, sleep disturbance (wearing oxygen at 2L around the clock) and suicidal ideas. The patient is not nervous/anxious.    Vitals:   12/24/17 1329  BP: 117/73  Pulse: 76  Resp: 18  SpO2: 100%  Weight: 142 lb 8 oz (64.6 kg)  Height: 5\' 5"  (1.651 m)   Wt Readings from Last 3 Encounters:  12/24/17 142 lb 8 oz (64.6 kg)  08/30/17 140 lb 8 oz (63.7 kg)  04/27/17 135 lb 8 oz (61.5 kg)   Lab Results  Component Value Date   CREATININE 1.08 (H) 02/15/2017   CREATININE 0.94 01/05/2017   CREATININE 1.00 01/04/2017    Physical Exam  Constitutional: She is oriented to person, place, and time. She appears well-developed and  well-nourished.  HENT:  Head: Normocephalic and atraumatic.  Neck: Normal range of motion. Neck supple. No JVD present.  Cardiovascular: Normal rate and regular rhythm.  Pulmonary/Chest: Effort normal. She has no wheezes. She has no rales.  Abdominal: Soft. She exhibits no distension. There is no tenderness.  Musculoskeletal: She exhibits no edema or tenderness.  Neurological: She is alert and oriented to person, place, and time.  Skin: Skin is warm and dry.  Psychiatric: She has a normal mood and affect. Her behavior is normal. Thought content normal.  Nursing note and vitals reviewed.   Assessment & Plan:  1: Chronic heart failure with reduced ejection fraction- - NYHA class II - euvolemic today - weighing daily. Advised to call for an overnight weight gain of >2 pounds or a weekly weight gain of >5 pounds.  - weight stable at this time - not adding salt to food and daughter is reading food labels.  - she is unsure of how much fluid she is drinking. Discussed measuring it so that she maintains her fluid intake between 40-50 ounces daily - unable to change to entresto due to blood pressure - saw cardiologist (Fath) 11/12/17  2: HTN- - BP looks good today - BMP from 08/31/17 reviewed and shows potassium 4.4, sodium 139 and GFR 53 - saw PCP Allena Katz) 06/07/17  Patient did not bring her medications nor a list. Each medication was verbally reviewed with the patient and she was encouraged to bring the bottles to every visit to confirm accuracy of list.  Will not make a return appointment for patient at this time. Advised patient that she could call back at anytime to make another appointment.

## 2017-12-24 ENCOUNTER — Ambulatory Visit: Payer: Medicare Other | Attending: Family | Admitting: Family

## 2017-12-24 ENCOUNTER — Encounter: Payer: Self-pay | Admitting: Family

## 2017-12-24 VITALS — BP 117/73 | HR 76 | Resp 18 | Ht 65.0 in | Wt 142.5 lb

## 2017-12-24 DIAGNOSIS — I110 Hypertensive heart disease with heart failure: Secondary | ICD-10-CM | POA: Diagnosis not present

## 2017-12-24 DIAGNOSIS — I5022 Chronic systolic (congestive) heart failure: Secondary | ICD-10-CM

## 2017-12-24 DIAGNOSIS — Z888 Allergy status to other drugs, medicaments and biological substances status: Secondary | ICD-10-CM | POA: Insufficient documentation

## 2017-12-24 DIAGNOSIS — Z8719 Personal history of other diseases of the digestive system: Secondary | ICD-10-CM | POA: Diagnosis not present

## 2017-12-24 DIAGNOSIS — Z87891 Personal history of nicotine dependence: Secondary | ICD-10-CM | POA: Diagnosis not present

## 2017-12-24 DIAGNOSIS — I10 Essential (primary) hypertension: Secondary | ICD-10-CM

## 2017-12-24 DIAGNOSIS — E890 Postprocedural hypothyroidism: Secondary | ICD-10-CM | POA: Insufficient documentation

## 2017-12-24 DIAGNOSIS — Z7982 Long term (current) use of aspirin: Secondary | ICD-10-CM | POA: Diagnosis not present

## 2017-12-24 DIAGNOSIS — Z79890 Hormone replacement therapy (postmenopausal): Secondary | ICD-10-CM | POA: Insufficient documentation

## 2017-12-24 DIAGNOSIS — Z90710 Acquired absence of both cervix and uterus: Secondary | ICD-10-CM | POA: Diagnosis not present

## 2017-12-24 DIAGNOSIS — Z79899 Other long term (current) drug therapy: Secondary | ICD-10-CM | POA: Insufficient documentation

## 2017-12-24 DIAGNOSIS — I509 Heart failure, unspecified: Secondary | ICD-10-CM | POA: Diagnosis present

## 2017-12-24 NOTE — Patient Instructions (Signed)
Continue weighing daily and call for an overnight weight gain of > 2 pounds or a weekly weight gain of >5 pounds. 

## 2018-06-07 ENCOUNTER — Other Ambulatory Visit: Payer: Self-pay | Admitting: Family Medicine

## 2018-06-07 DIAGNOSIS — F0391 Unspecified dementia with behavioral disturbance: Secondary | ICD-10-CM

## 2018-06-07 DIAGNOSIS — F03918 Unspecified dementia, unspecified severity, with other behavioral disturbance: Secondary | ICD-10-CM

## 2018-06-13 ENCOUNTER — Ambulatory Visit: Payer: Medicare Other

## 2018-06-22 ENCOUNTER — Ambulatory Visit: Payer: Medicare Other | Attending: Family Medicine

## 2018-07-21 IMAGING — DX DG CHEST 1V PORT
1 series · 1 of 1 positions shown · non-contrast
Comparison: 12/12/2016

CLINICAL DATA: CHF.

EXAM:
PORTABLE CHEST 1 VIEW

[chest ap]
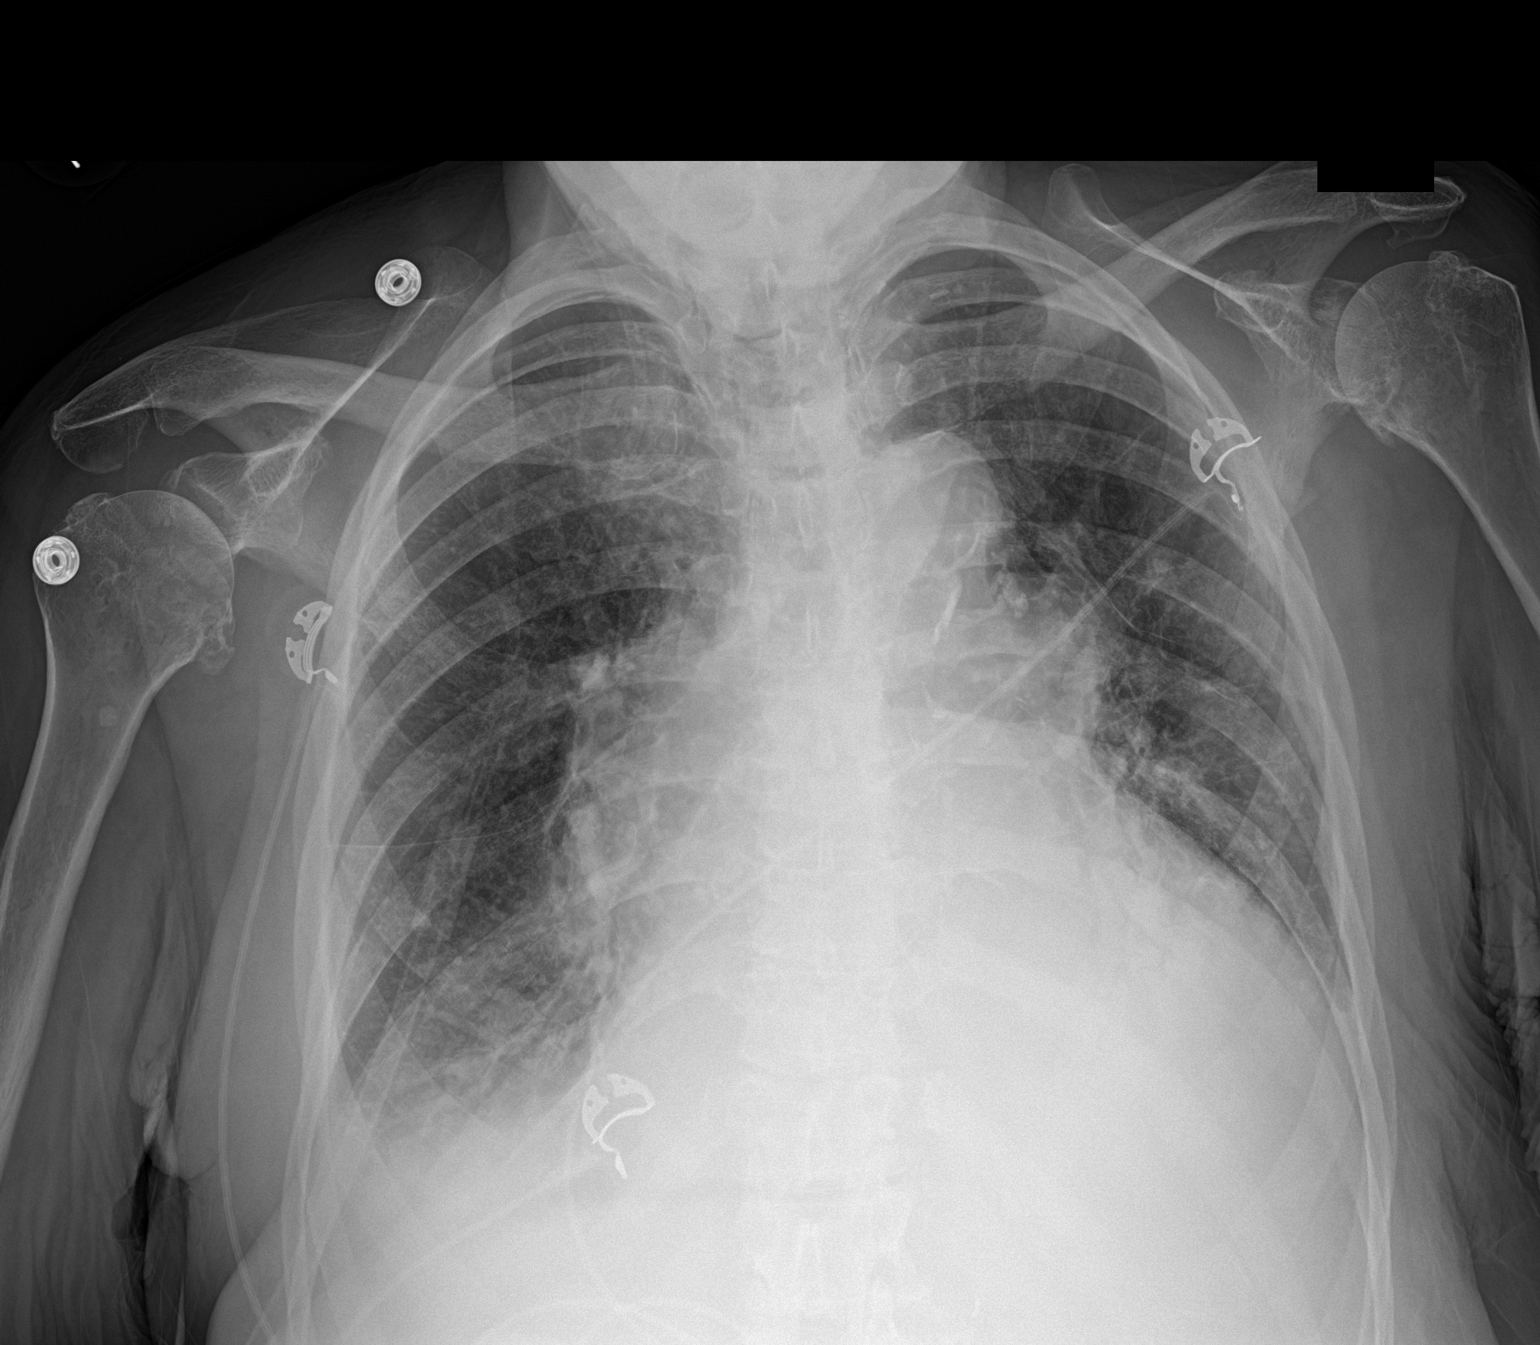

[1 of 1 positions shown; findings below may reference images not displayed]

FINDINGS: Lungs are adequately inflated with persistent left base/
retrocardiac opacification likely effusion with atelectasis. Mild
stable right base opacification likely small effusion with
atelectasis. Cannot exclude infection in the lung bases. Improved
perihilar markings compatible with resolving vascular congestion.
Stable cardiomegaly. Remainder of the exam is unchanged.
IMPRESSION: Near resolution of previously noted vascular congestion. Stable
moderate cardiomegaly.

Persistent left base opacification and mild right base opacification
compatible with small effusions with atelectasis left worse than
right. Cannot exclude infection in the lung bases.

## 2018-08-10 IMAGING — DX DG CHEST 1V
1 series · 1 of 1 positions shown · non-contrast
Comparison: 12/13/2016

CLINICAL DATA: Respiratory distress.

EXAM:
CHEST 1 VIEW

[chest ap]
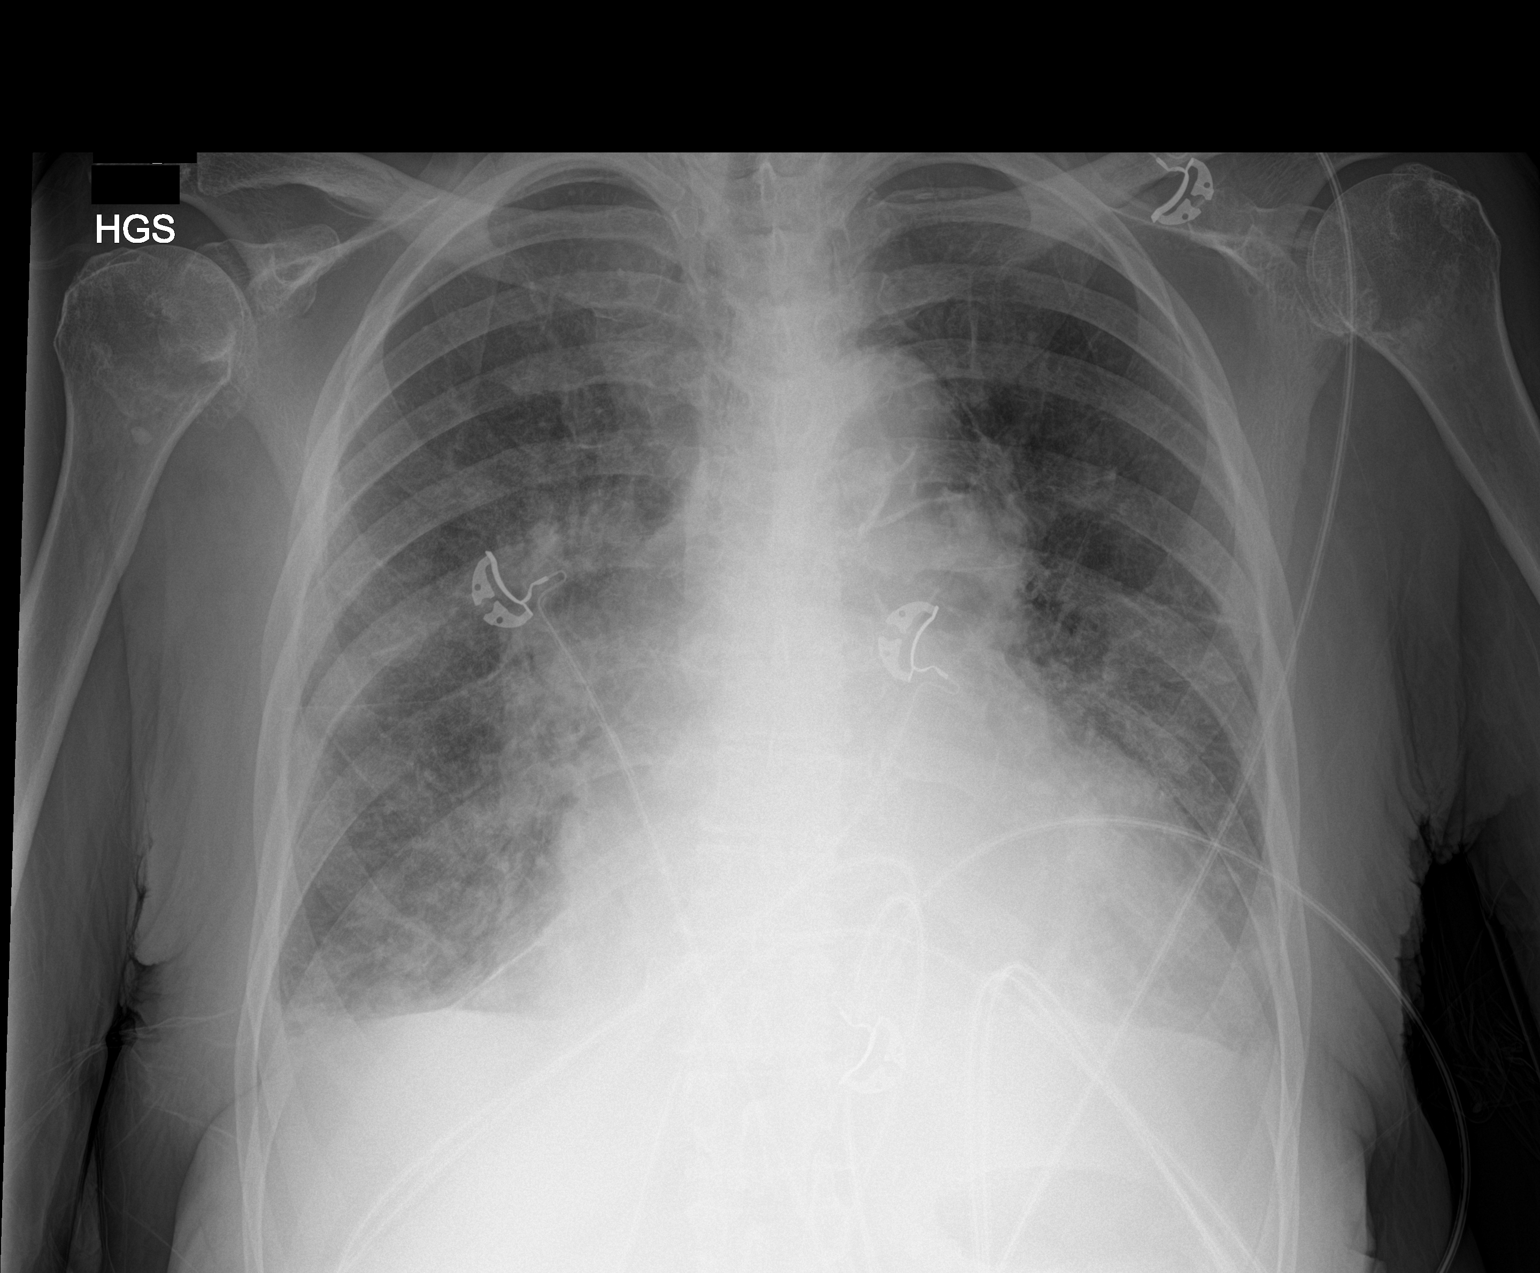

[1 of 1 positions shown; findings below may reference images not displayed]

FINDINGS: The heart is enlarged but stable. Persistent tortuosity, ectasia and
calcification of the thoracic aorta. The lungs demonstrate pulmonary
edema and small pleural effusions. The bony thorax is intact.
IMPRESSION: CHF.

## 2021-10-27 ENCOUNTER — Encounter: Payer: Self-pay | Admitting: *Deleted

## 2021-10-29 ENCOUNTER — Inpatient Hospital Stay: Payer: Medicare Other

## 2021-10-29 ENCOUNTER — Inpatient Hospital Stay: Payer: Medicare Other | Attending: Oncology | Admitting: Oncology

## 2022-10-29 DEATH — deceased
